# Patient Record
Sex: Male | Born: 1970 | Race: Black or African American | Hispanic: No | Marital: Single | Smoking: Light tobacco smoker
Health system: Southern US, Community
[De-identification: ages and names within clinical notes are randomized; demographics above are authoritative.]

## PROBLEM LIST (undated history)

## (undated) DIAGNOSIS — I517 Cardiomegaly: Secondary | ICD-10-CM

## (undated) DIAGNOSIS — I10 Essential (primary) hypertension: Secondary | ICD-10-CM

## (undated) DIAGNOSIS — R9431 Abnormal electrocardiogram [ECG] [EKG]: Secondary | ICD-10-CM

## (undated) HISTORY — DX: Cardiomegaly: I51.7

## (undated) HISTORY — DX: Abnormal electrocardiogram (ECG) (EKG): R94.31

## (undated) HISTORY — DX: Essential (primary) hypertension: I10

---

## 2002-02-14 ENCOUNTER — Ambulatory Visit (HOSPITAL_BASED_OUTPATIENT_CLINIC_OR_DEPARTMENT_OTHER): Admission: RE | Admit: 2002-02-14 | Discharge: 2002-02-14 | Payer: Self-pay | Admitting: *Deleted

## 2002-02-23 ENCOUNTER — Encounter: Payer: Self-pay | Admitting: Family Medicine

## 2002-02-23 ENCOUNTER — Encounter: Admission: RE | Admit: 2002-02-23 | Discharge: 2002-02-23 | Payer: Self-pay | Admitting: Family Medicine

## 2010-11-17 ENCOUNTER — Encounter: Payer: 59 | Attending: Family Medicine

## 2013-01-23 ENCOUNTER — Other Ambulatory Visit: Payer: Self-pay | Admitting: Orthopedic Surgery

## 2013-01-23 DIAGNOSIS — M542 Cervicalgia: Secondary | ICD-10-CM

## 2013-01-24 ENCOUNTER — Ambulatory Visit
Admission: RE | Admit: 2013-01-24 | Discharge: 2013-01-24 | Disposition: A | Discharge status: Home / Self Care | Payer: 59 | Source: Ambulatory Visit | Attending: Orthopedic Surgery | Admitting: Orthopedic Surgery

## 2013-01-24 DIAGNOSIS — M542 Cervicalgia: Secondary | ICD-10-CM

## 2013-02-07 ENCOUNTER — Ambulatory Visit (INDEPENDENT_AMBULATORY_CARE_PROVIDER_SITE_OTHER): Payer: 59 | Admitting: Cardiology

## 2013-02-07 ENCOUNTER — Encounter: Payer: Self-pay | Admitting: Cardiology

## 2013-02-07 VITALS — BP 142/100 | HR 70 | Ht 72.0 in | Wt 235.8 lb

## 2013-02-07 DIAGNOSIS — R9431 Abnormal electrocardiogram [ECG] [EKG]: Secondary | ICD-10-CM | POA: Insufficient documentation

## 2013-02-07 DIAGNOSIS — I10 Essential (primary) hypertension: Secondary | ICD-10-CM

## 2013-02-07 MED ORDER — HYDROCHLOROTHIAZIDE 25 MG PO TABS: 25.0000 mg | ORAL_TABLET | Freq: Every day | ORAL | Status: DC

## 2013-02-07 NOTE — Assessment & Plan Note (Addendum)
Not adequately controlled.  Do not want LVH to "catch up with" ECG.  PLAN: Restart HCTZ 12.5 mg -- may need to increase to 25mg , just make sure he takes it. Continue Bystolic and losartan the current dose. If HCTZ does not prove effective, would switch to chlorthalidone.  ROV 1 yr.

## 2013-02-07 NOTE — Assessment & Plan Note (Signed)
I have given him a copy of the ECG in the past to avoid unnecessary concern for STEMI.

## 2013-02-07 NOTE — Progress Notes (Addendum)
Patient ID: Samuel Gutierrez, male   DOB: [birth year], 42 y.o.   MRN: [id number] PCP: Paulino Rily, MD  Clinic Note: Chief Complaint  Patient presents with  . 7 month visit    no chest, no sob,no swelling  have not taken med regular    HPI: Samuel Gutierrez is a 42 y.o. male with a PMH below who presents today for follow up of his hypertension. 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  His echo did not confirm the clear ECG evidence of LVH. When I last saw him, his BP was still up a bit & he had not been taking his HCTZ as directed -- this is the case again today.  Interval History: He has been doing well, and after his recent PCP visit for his annual exam, his BP was great.  He decided to stop the HCTZ.  He otherwise denies any Headache, blurred vision or dyspnea related to his BP.    The remainder of cardiac review of systems is as follows: no chest pain or dyspnea on exertion positive for - his self awareness that he has been non-adrent with medications. negative for - edema, irregular heartbeat, loss of consciousness, murmur, orthopnea, palpitations, paroxysmal nocturnal dyspnea, rapid heart rate or shortness of breath.   No lightheadedness, dizziness, wooziness, or syncope/near syncope.  No TIA or Amaurosis Fugax symptoms.  No Melena, hematochezia or hematuria.  Past Medical History  Diagnosis Date  . Benign essential HTN     Has a history of medical nonadherence  . Abnormal resting ECG findings     LVH with repolarization abnormalities.    Prior Cardiac Evaluation and Past Surgical History: Past Surgical History  Procedure Laterality Date  . Doppler echocardiography  March 2010    Borderline LVH, normal function normal relaxation. EF greater than 55%.   Not on File  Current  Outpatient Prescriptions  Medication Sig Dispense Refill  . losartan (COZAAR) 100 MG tablet Take 100 mg by mouth daily.      . multivitamin (ONE-A-DAY MEN'S) TABS Take 1 tablet by mouth daily.      . Nebivolol HCl (BYSTOLIC) 20 MG TABS Take by mouth daily.      . hydrochlorothiazide (HYDRODIURIL) 25 MG tablet Take 1 tablet (25 mg total) by mouth daily.  30 tablet  11   No current facility-administered medications for this visit.    History   Social History  . Marital Status: Single    Spouse Name: N/A    Number of Children: N/A  . Years of Education: N/A   Occupational History  . Not on file.   Social History Main Topics  . Smoking status: Light Tobacco Smoker    Types: Cigars  . Smokeless tobacco: Not on file     Comment: once month; maybe one cigar; cessation counseling not needed  . Alcohol Use: No  . Drug Use: No  . Sexually Active: Not on file   Other Topics Concern  . 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 He is extremely fit and healthy doing routine weightlifting and cardiovascular exercises -- working out 5 days a week   He is currently single with 2 children -- his  schedule is not amenable to relationship with the children's mother.   He does a knowledge of having occasional glass of wine socially. He also will have occasional me one cigar a month with his friends, but nothing significant.   ROS: A comprehensive Review of Systems - Negative except a few MSK aches and pains.  PHYSICAL EXAM BP 142/100  Pulse 70  Ht 6' (1.829 m)  Wt 235 lb 12.8 oz (106.958 kg)  BMI 31.97 kg/m2 General appearance: alert, cooperative, appears stated age, no distress and muscular, healthy. Answers ?s appropriately.  Pleasant mood & affect. Neck: no adenopathy, no carotid bruit, no JVD, supple, symmetrical, trachea midline and thyroid not enlarged, symmetric, no tenderness/mass/nodules Lungs: clear to  auscultation bilaterally, normal percussion bilaterally and non-labored. Good air movement Heart: regular rate and rhythm, S1, S2 normal, no murmur, click, rub or gallop and normal apical impulse Abdomen: soft, non-tender; bowel sounds normal; no masses,  no organomegaly Extremities: extremities normal, atraumatic, no cyanosis or edema Pulses: 2+ and symmetric Neurologic: Grossly normal  [id number] today: Yes Rate: 70 , Rhythm: LVH with significant repolarization abnormality; continues to be read as anterior STEMI, not consistent with ischemia but more with repolarization abnormality. (He carries a copy of this with him just in case)  Recent Labs:   Lipid panel: Total cholesterol 139, triglycerides 80, HDL 32, LDL 89 -- outstanding  PSA 0.88; TSH 1.24  Chem 10: sodium 142, potassium 4.0 chloride 17, bicarbonate 24, glucose 108, BUN/creatinine 11/1.26. Total bilirubin 0.4 alkaline phosphatase 55, AST 40 by, ALT 42 total protein 6.4, albumin 4.1, calcium 9.2  ASSESSMENT/PLAN:  Benign essential HTN Not adequately controlled.  Do not want LVH to "catch up with" ECG.  PLAN: Restart HCTZ 12.5 mg -- may need to increase to 25mg .  ROV 1 yr.  Abnormal resting ECG findings I have given him a copy of the ECG in the past to avoid unnecessary concern for STEMI.   Orders Placed This Encounter  Procedures  . EKG 12-Lead   Followup: 12 months  Mckinzee Spirito W. Herbie Baltimore, M.D., M.S. [facility] [address] [place], [place]  [address]  [phone] Pager # [phone]

## 2013-02-07 NOTE — Patient Instructions (Addendum)
Your physician wants you to follow-up in 12 months Dr Herbie Baltimore. You will receive a reminder letter in the mail two months in advance. If you don't receive a letter, please call our office to schedule the follow-up appointment.    Your physician recommends that you continue on your current medications as directed. Please refer to the Current Medication list given to you today.  Marykay Lex, MD

## 2013-02-16 ENCOUNTER — Encounter: Payer: Self-pay | Admitting: Cardiology

## 2013-06-27 ENCOUNTER — Telehealth: Payer: Self-pay | Admitting: Cardiology

## 2013-06-27 NOTE — Telephone Encounter (Signed)
Bystolic 20 mg is on back order,need to change it to something else please.

## 2013-06-27 NOTE — Telephone Encounter (Signed)
Called pharmacy to inform that patient can take 2-10mg  tabs of bystolic since 20mg  tabs is on back order

## 2013-07-19 ENCOUNTER — Telehealth: Payer: Self-pay | Admitting: Cardiology

## 2013-07-19 MED ORDER — NEBIVOLOL HCL 10 MG PO TABS: 20.0000 mg | ORAL_TABLET | Freq: Every day | ORAL | Status: DC

## 2013-07-19 NOTE — Telephone Encounter (Signed)
Went to pharmacy [facility] [address]  --to pick up his Bystolic and they told him the drug has been discontinued.  Needs to know what can he take now  Please call

## 2013-07-19 NOTE — Telephone Encounter (Signed)
Spoke to patient.He states that [facility] informed [facility] that [facility]olic  20 mg is not available anymore . RN informed patient that there is a problem with the supply of that dose.  He will have to take (2) 10 mg tablet to equal 20 mg dose at the present time until 20 mg become available. He verbalized understanding. Samples are available at the [facility]. Patient will pick  them (6 boxes) up.     Called and spoke to pharmacist Hydrographic surveyor) at [facility] - she states prescription has been changed earlier in the month, but there store is out of 10 mg tablets at the present time.RN asked if they can refill when dose come in stock . Crystal states yes.  RN will inform patient when he picks up samples

## 2013-08-22 ENCOUNTER — Other Ambulatory Visit: Payer: Self-pay | Admitting: Cardiology

## 2013-08-22 NOTE — Telephone Encounter (Signed)
Rx was sent to pharmacy electronically. 

## 2014-05-30 ENCOUNTER — Other Ambulatory Visit: Payer: Self-pay | Admitting: Cardiology

## 2014-05-31 NOTE — Telephone Encounter (Signed)
Rx refill sent to patient pharmacy   

## 2014-06-01 ENCOUNTER — Other Ambulatory Visit: Payer: Self-pay | Admitting: Cardiology

## 2014-06-01 NOTE — Telephone Encounter (Signed)
Rx was sent to pharmacy electronically. 

## 2014-06-26 ENCOUNTER — Other Ambulatory Visit: Payer: Self-pay | Admitting: Cardiology

## 2014-06-26 NOTE — Telephone Encounter (Signed)
Rx was sent to pharmacy electronically. 

## 2014-08-06 ENCOUNTER — Other Ambulatory Visit: Payer: Self-pay | Admitting: Cardiology

## 2014-08-08 ENCOUNTER — Ambulatory Visit (INDEPENDENT_AMBULATORY_CARE_PROVIDER_SITE_OTHER): Payer: 59 | Admitting: Cardiology

## 2014-08-08 VITALS — BP 148/90 | HR 61 | Ht 72.0 in | Wt 244.2 lb

## 2014-08-08 DIAGNOSIS — R9431 Abnormal electrocardiogram [ECG] [EKG]: Secondary | ICD-10-CM

## 2014-08-08 DIAGNOSIS — I10 Essential (primary) hypertension: Secondary | ICD-10-CM

## 2014-08-08 MED ORDER — HYDROCHLOROTHIAZIDE 25 MG PO TABS: 25.0000 mg | ORAL_TABLET | Freq: Every day | ORAL | Status: DC

## 2014-08-08 NOTE — Patient Instructions (Signed)
Your physician wants you to follow-up in: 1 Year. You will receive a reminder letter in the mail two months in advance. If you don't receive a letter, please call our office to schedule the follow-up appointment.  

## 2014-08-10 ENCOUNTER — Encounter: Payer: Self-pay | Admitting: Cardiology

## 2014-08-10 NOTE — Progress Notes (Signed)
Patient ID: Samuel Gutierrez, male   DOB: [birth year], 43 y.o.   MRN: [id number] PCP: Paulino Rily, MD  Clinic Note: Chief Complaint  Patient presents with  . Follow-up    overdue 1 year. no complaints.   . Hypertension  . Abnormal ECG    HPI: Samuel Gutierrez is a 44 y.o. male with a PMH below who presents today for follow up of his hypertension. 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  His echo did not confirm the clear ECG evidence of LVH. When I last saw him, his BP was still up a bit & he had not been taking his HCTZ as directed -- this is the case again today.he stopped taking it when his prescription ran out.  Interval History: He has been doing well, and after his recent PCP visit for his annual exam, his BP was great.  He decided to stop the HCTZ.  He otherwise denies any Headache, blurred vision or dyspnea related to his BP.   He is very active and works out routinely with no cardiac complaints. No heart failure or anginal symptoms.  The remainder of cardiac review of systems is as follows: no chest pain or dyspnea on exertion positive for - his self awareness that he has been non-adrent with medications. negative for - edema, irregular heartbeat, loss of consciousness, murmur, orthopnea, palpitations, paroxysmal nocturnal dyspnea, rapid heart rate or shortness of breath, lightheadedness, dizziness, wooziness, syncope/near syncope, or TIA or Amaurosis Fugax symptoms.  No claudication..  Past Medical History  Diagnosis Date  . Benign essential HTN     Has a history of medical nonadherence  . Abnormal resting ECG findings     LVH with repolarization abnormalities.  Marland Kitchen LVH (left ventricular hypertrophy)     borderline    Prior Cardiac Evaluation  and Past Surgical History: Past Surgical History  Procedure Laterality Date  . Doppler echocardiography  10/03/2008    Borderline LVH, normal function normal relaxation. EF greater than 55%.   No Known Allergies  Current Outpatient Prescriptions  Medication Sig Dispense Refill  . BYSTOLIC 10 MG tablet TAKE 2 TABLETS (20 MG TOTAL) BY MOUTH DAILY. <PLEASE MAKE APPOINTMENT FOR REFILLS> 60 tablet 0  . losartan (COZAAR) 100 MG tablet Take 1 tablet (100 mg total) by mouth daily. MUST KEEP APPOINTMENT 08/08/2014 WITH DR HARDING FOR FUTURE REFILLS. 30 tablet 0  . multivitamin (ONE-A-DAY MEN'S) TABS Take 1 tablet by mouth daily.    . hydrochlorothiazide (HYDRODIURIL) 25 MG tablet Take 1 tablet (25 mg total) by mouth daily. 30 tablet 11   No current facility-administered medications for this visit.   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   He does a knowledge of having occasional glass of wine socially. He also will have occasional me one cigar a month with his friends, but nothing significant.    ROS: A comprehensive Review of Systems - Negative except a few MSK aches and  pains.  PHYSICAL EXAM BP 148/90 mmHg  Pulse 61  Ht 6' (1.829 m)  Wt 244 lb 3.2 oz (110.768 kg)  BMI 33.11 kg/m2 General appearance: alert, cooperative, appears stated age, no distress and muscular, healthy. Answers ?s appropriately.  Pleasant mood & affect. Neck: no adenopathy, no carotid bruit, no JVD, supple, symmetrical, trachea midline and thyroid not enlarged, symmetric, no tenderness/mass/nodules Lungs: clear to auscultation bilaterally, normal percussion bilaterally and non-labored. Good air movement Heart: regular  rate and rhythm, S1, S2 normal, no murmur, click, rub or gallop and normal apical impulse Abdomen: soft, non-tender; bowel sounds normal; no masses,  no organomegaly Extremities: extremities normal, atraumatic, no cyanosis or edema Pulses: 2+ and symmetric Neurologic: Grossly normal  WUJ:[id number]:Performed today: Yes Rate: 61, Rhythm: LVH with significant repolarization abnormality; Diffuse V. Symmetric T-wave inversions in leads V3 through V5 and 123 aVL and aVF consistent with repolarization. Also noted are subtle J-point elevations in V1 and V2. no change from previous EKG.   Recent Labs:   No recent labs available   ASSESSMENT/PLAN:  Essential hypertension Again, the upper level of normal. Lites revealed his HCTZ. I refilled his prescription.   Abnormal resting ECG findings Significant repolarization changes the EKG. No change from previous. Has had a negative cardiac evaluation today. Continues to carry a copy of his EKG along with him just in case.    Orders Placed This Encounter  Procedures  . EKG 12-Lead   Followup: 12 months  HARDING, Piedad ClimesAVID W, M.D., M.S. Interventional Cardiologist   Pager # [phone]

## 2014-08-10 NOTE — Assessment & Plan Note (Signed)
Again, the upper level of normal. Lites revealed his HCTZ. I refilled his prescription.

## 2014-08-10 NOTE — Assessment & Plan Note (Signed)
Significant repolarization changes the EKG. No change from previous. Has had a negative cardiac evaluation today. Continues to carry a copy of his EKG along with him just in case.

## 2014-08-16 ENCOUNTER — Other Ambulatory Visit: Payer: Self-pay | Admitting: Cardiology

## 2014-08-16 NOTE — Telephone Encounter (Signed)
Rx has been sent to the pharmacy electronically. ° °

## 2014-09-12 ENCOUNTER — Other Ambulatory Visit: Payer: Self-pay | Admitting: Cardiology

## 2014-09-12 NOTE — Telephone Encounter (Signed)
Rx(s) sent to pharmacy electronically.  

## 2015-02-17 ENCOUNTER — Encounter (HOSPITAL_COMMUNITY): Payer: Self-pay | Admitting: Emergency Medicine

## 2015-02-17 ENCOUNTER — Emergency Department (HOSPITAL_COMMUNITY)
Admission: EM | Admit: 2015-02-17 | Discharge: 2015-02-17 | Disposition: A | Discharge status: Home / Self Care | Payer: 59 | Source: Home / Self Care | Attending: Family Medicine | Admitting: Family Medicine

## 2015-02-17 DIAGNOSIS — K529 Noninfective gastroenteritis and colitis, unspecified: Secondary | ICD-10-CM | POA: Diagnosis not present

## 2015-02-17 DIAGNOSIS — I10 Essential (primary) hypertension: Secondary | ICD-10-CM

## 2015-02-17 NOTE — ED Notes (Addendum)
Reports going out to eat Friday 7/29.  Reports 2 hours later having nausea, vomiting, and diarrhea.  Today stools are more normal than they have been and vomited x 1 this morning.  Since then has been drinking gatorade and holding it down.  Denies dizziness or lightheadedness

## 2015-02-17 NOTE — ED Provider Notes (Addendum)
CSN: [id number]     Arrival date & time 02/17/15  1512 History   First MD Initiated Contact with Patient 02/17/15 1619     Chief Complaint  Patient presents with  . Emesis  . Diarrhea    Patient is a 44 y.o. male presenting with vomiting, diarrhea, and hypertension. The history is provided by the patient. No language interpreter was used.  Emesis Severity:  Moderate Timing:  Intermittent Quality:  Stomach contents Able to tolerate:  Liquids Progression:  Partially resolved (Last vomitus was this morning) Chronicity:  New Relieved by:  Nothing Associated symptoms: diarrhea   Associated symptoms: no abdominal pain and no fever   Risk factors: no sick contacts and no travel to endemic areas   Risk factors comment:  This started 2 hrs after eating from [employer] Diarrhea Associated symptoms: vomiting   Associated symptoms: no abdominal pain   Hypertension This is a chronic problem. Episode onset: 10 years ago. The problem occurs constantly. Pertinent negatives include no chest pain and no abdominal pain. Relieved by: Losartan and Bystolic are the two medication he is on for BP. The treatment provided moderate relief.   BP check at home are sometimes elevated. He is worried his BP is slightly elevated than usual today.   Past Medical History  Diagnosis Date  . Benign essential HTN     Has a history of medical nonadherence  . Abnormal resting ECG findings     LVH with repolarization abnormalities.  Marland Kitchen LVH (left ventricular hypertrophy)     borderline   Past Surgical History  Procedure Laterality Date  . Doppler echocardiography  10/03/2008    Borderline LVH, normal function normal relaxation. EF greater than 55%.   No family history on file. History  Substance Use Topics  . Smoking status: Light Tobacco Smoker    Types: Cigars  . Smokeless tobacco: Not on file     Comment: once month; maybe one cigar; cessation counseling not needed  . Alcohol Use: No    Review of  Systems  Respiratory: Negative.   Cardiovascular: Negative.  Negative for chest pain.  Gastrointestinal: Positive for vomiting and diarrhea. Negative for abdominal pain, blood in stool and abdominal distention.  Genitourinary: Negative.   All other systems reviewed and are negative.  Filed Vitals:   02/17/15 1600  BP: 166/95  Pulse: 66  Temp: 97.9 F (36.6 C)  TempSrc: Oral  Resp: 18  SpO2: 97%     Allergies  Review of patient's allergies indicates no known allergies.  Home Medications   Prior to Admission medications   Medication Sig Start Date End Date Taking? Authorizing Provider  hydrochlorothiazide (HYDRODIURIL) 25 MG tablet Take 1 tablet (25 mg total) by mouth daily. 08/08/14   Marykay Lex, MD  losartan (COZAAR) 100 MG tablet Take 1 tablet (100 mg total) by mouth daily. 08/16/14   Marykay Lex, MD  multivitamin (ONE-A-DAY MEN'S) TABS Take 1 tablet by mouth daily.    Historical Provider, MD  nebivolol (BYSTOLIC) 10 MG tablet Take 2 tablets (20 mg total) by mouth daily. 09/12/14   Marykay Lex, MD   BP 166/95 mmHg  Pulse 66  Temp(Src) 97.9 F (36.6 C) (Oral)  Resp 18  SpO2 97% Physical Exam  Constitutional: He appears well-developed. No distress.  Cardiovascular: Normal rate, regular rhythm, normal heart sounds and intact distal pulses.   No murmur heard. Pulmonary/Chest: Effort normal and breath sounds normal. No respiratory distress. He has no wheezes.  Abdominal: Soft. Bowel sounds are normal. He exhibits no distension and no mass. There is no tenderness.  Neurological: He is alert.  Nursing note and vitals reviewed.   ED Course  Procedures (including critical care time) Labs Review Labs Reviewed - No data to display  Imaging Review No results found.   MDM  No diagnosis found. Gastroenteritis: food poisoning vs viral infection HTN   As discussed with him, he likely has food poisoning. Symptoms rapidly improving. I recommended good oral and  hand hygiene. Improve oral hydration as tolerated. May use Imodium if symptoms returns. Return precaution discussed.  BP rechecked today remains at 162/90. As discussed with him, no adjustment to his medication at this time. Follow up with PCP soon for reassessment and further management.    Doreene Eland, MD 02/17/15 1637  Doreene Eland, MD 02/17/15 9604

## 2015-02-17 NOTE — Discharge Instructions (Signed)
Food Poisoning °Food poisoning is an illness caused by something you ate or drank. It usually lasts 1 to 2 days. Problems may be worse for people with low immune systems, the elderly, children and infants, and pregnant women. °HOME CARE °· Drink enough water and fluids to keep your pee (urine) clear or pale yellow. Drink small amounts often. °· Ask your doctor how to replace body fluid losses (rehydration). °· Avoid: °¨ Foods high in sugar. °¨ Alcohol. °¨ Bubbly (carbonated) drinks. °¨ Tobacco. °¨ Juice. °¨ Caffeine drinks. °¨ Very hot or cold fluids. °¨ Fatty, greasy foods. °¨ Eating too much at one time. °¨ Dairy products until 24 to 48 hours after watery poop (diarrhea) stops. °· You may eat foods with active cultures (probiotics). They can be found in some yogurts and supplements. °· Wash your hands well to avoid spreading the illness. °· Only take medicines as told by your doctor. Do not give aspirin to children. °· Ask your doctor if you should keep taking your regular medicines. °GET HELP RIGHT AWAY IF:  °· You have trouble breathing, swallowing, talking, or moving. °· You have blurry vision. °· You cannot keep fluids down. °· You pass out (faint) or almost pass out. °· Your eyes turn yellow. °· You keep throwing up (vomiting) or having watery poop. °· Belly (abdominal) pain starts, gets worse, or is just in one small spot (localizes). °· You have a fever. °· Your watery poop has blood in it. °· You feel very weak, dizzy, or thirsty. °· You do not pee for 8 hours. °MAKE SURE YOU:  °· Understand these instructions. °· Will watch your condition. °· Will get help right away if you are not doing well or get worse. °Document Released: 12/24/2009 Document Revised: 09/28/2011 Document Reviewed: 11/21/2010 °Exit[employer]® Patient Information ©2015 [employer]. This information is not intended to replace advice given to you by your health care provider. Make sure you discuss any questions you have with your health care  provider. ° °

## 2015-08-09 ENCOUNTER — Encounter: Payer: Self-pay | Admitting: Cardiology

## 2015-08-09 ENCOUNTER — Ambulatory Visit (INDEPENDENT_AMBULATORY_CARE_PROVIDER_SITE_OTHER): Payer: 59 | Admitting: Cardiology

## 2015-08-09 ENCOUNTER — Telehealth: Payer: Self-pay | Admitting: Cardiology

## 2015-08-09 VITALS — BP 164/100 | HR 74 | Ht 72.0 in | Wt 237.0 lb

## 2015-08-09 DIAGNOSIS — R9431 Abnormal electrocardiogram [ECG] [EKG]: Secondary | ICD-10-CM | POA: Diagnosis not present

## 2015-08-09 DIAGNOSIS — I10 Essential (primary) hypertension: Secondary | ICD-10-CM | POA: Diagnosis not present

## 2015-08-09 MED ORDER — NEBIVOLOL HCL 10 MG PO TABS: 20.0000 mg | ORAL_TABLET | Freq: Every day | ORAL | Status: DC

## 2015-08-09 MED ORDER — IRBESARTAN 300 MG PO TABS: 300.0000 mg | ORAL_TABLET | Freq: Every day | ORAL | Status: DC

## 2015-08-09 MED ORDER — HYDROCHLOROTHIAZIDE 25 MG PO TABS: 25.0000 mg | ORAL_TABLET | Freq: Every day | ORAL | Status: DC

## 2015-08-09 MED ORDER — NEBIVOLOL HCL 20 MG PO TABS: 20.0000 mg | ORAL_TABLET | Freq: Every day | ORAL | Status: DC

## 2015-08-09 NOTE — Patient Instructions (Signed)
Stop losartan   Start irbesartan 300 mg one tablet daily - for the first week take 1/2 tablet daily then increase  Your physician recommends that you schedule a follow-up appointment in 1 week with [facility] for blood  Pressure check .  Check blood pressure periodically.   Your physician wants you to follow-up in 12 month with Dr Herbie Baltimore.  You will receive a reminder letter in the mail two months in advance. If you don't receive a letter, please call our office to schedule the follow-up appointment.

## 2015-08-09 NOTE — Telephone Encounter (Signed)
SPOKE TO PATIENT  UNABLE TO PURCHASE BYSTOLIC - TO EARLY FOR REFILL - PATIENT LOST CURRENT BOTTLE  SAMPLES AVAILABLE FOR PICK UP

## 2015-08-09 NOTE — Telephone Encounter (Signed)
Returning your call. °

## 2015-08-09 NOTE — Progress Notes (Signed)
PCP: Paulino Rily, MD  Clinic Note: Chief Complaint  Patient presents with  . Follow-up    no chest pain, no shortness of breath, no swelling, no cramping, no dizziness or lightheadedness  . Hypertension  . Abnormal ECG    Significant T-wave inversions and ST depressions consistent with repolarization abnormality.    HPI: Samuel Gutierrez is a 45 y.o. male with a PMH below who presents today for annual f/u for HTN & abnormal EKG. 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 His echo did not confirm the clear ECG evidence of LVH.  Samuel Gutierrez was last seen on 08/08/2014 - borderline blood pressure control at that time. Added HCTZ.  Recent Hospitalizations: None  Studies Reviewed: None   Chemistry   No results found for: NA, K, CL, CO2, BUN, CREATININE, GLU No results found for: CALCIUM, ALKPHOS, AST, ALT, BILITOT   Interval History: Samuel Gutierrez presents today again without any major complaints. He had just gotten his medications refilled over the weekend, but somehow lost them in his car or they must have fallen out of his car between Monday and Tuesday. He has been out of his medicine since then. Thankfully, he has not noted any adverse effects such as blurred vision, headaches or dyspnea. He also notes that he works the night shift and coming in the morning sometimes he has not had a chance to take his medications. Sometimes his work to be relatively stressful and demanding.  He does note, there is blood pressures have been higher than usual.  Cardiac review symptoms No chest pain or shortness of breath with rest or exertion.  No PND, orthopnea or edema. No palpitations, lightheadedness, dizziness, weakness or syncope/near syncope. No TIA/amaurosis fugax  symptoms. No claudication.  ROS: A comprehensive was performed. Review of Systems  Constitutional: Negative for malaise/fatigue.  HENT: Negative for nosebleeds.   Eyes: Negative for blurred vision.  Respiratory: Negative.        He did have a cold earlier this month  Cardiovascular: Negative for claudication.  Gastrointestinal: Positive for heartburn (depending what he eats).  Genitourinary: Negative.   Musculoskeletal: Negative.   Neurological: Negative.  Negative for weakness and headaches.  Endo/Heme/Allergies: Negative.   Psychiatric/Behavioral: Negative.   All other systems reviewed and are negative.   Past Medical History  Diagnosis Date  . Benign essential HTN     Has a history of medical nonadherence  . Abnormal resting ECG findings     LVH with repolarization abnormalities.  Marland Kitchen LVH (left ventricular hypertrophy)     borderline    Past Surgical History  Procedure Laterality Date  . Doppler echocardiography  10/03/2008    Borderline LVH, normal function normal relaxation. EF greater than 55%.    Prior to Admission medications   Medication Sig Start Date End Date Taking? Authorizing Provider  hydrochlorothiazide (HYDRODIURIL) 25 MG tablet Take 1 tablet (25 mg total) by mouth daily. 08/08/14   Marykay Lex, MD  losartan (COZAAR) 100 MG tablet Take 1 tablet (100 mg total) by mouth daily. 08/16/14   Marykay Lex, MD  multivitamin (ONE-A-DAY MEN'S) TABS Take 1 tablet by mouth daily.    Historical Provider, MD  nebivolol (BYSTOLIC) 10 MG tablet Take 2 tablets (20 mg total) by mouth daily.  09/12/14   Marykay Lex, MD   No Known Allergies   Social History   Social History  . Marital Status: Single    Spouse Name: N/A  . Number of Children: N/A  . Years of Education: N/A   Social History Main Topics  . Smoking status: Light Tobacco Smoker    Types: Cigars  . Smokeless tobacco: None     Comment: once month; maybe one cigar; cessation counseling not needed    . Alcohol Use: No  . Drug Use: No  . Sexual Activity: Not Asked   Other Topics Concern  . 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   He does a knowledge of having occasional glass of wine socially. He also will have occasional me one cigar a month with his friends, but nothing significant.    History reviewed. No pertinent family history.   Wt Readings from Last 3 Encounters:  08/09/15 237 lb (107.502 kg)  08/08/14 244 lb 3.2 oz (110.768 kg)  02/07/13 235 lb 12.8 oz (106.958 kg)    PHYSICAL EXAM BP 164/100 mmHg  Pulse 74  Ht 6' (1.829 m)  Wt 237 lb (107.502 kg)  BMI 32.14 kg/m2 General appearance: alert, cooperative, appears stated age, no distress and muscular, healthy. Answers ?s appropriately. Pleasant mood & affect. Neck: no adenopathy, no carotid bruit, no JVD, supple, symmetrical, trachea midline and thyroid not enlarged, symmetric, no tenderness/mass/nodules Lungs: clear to auscultation bilaterally, normal percussion bilaterally and non-labored. Good air movement Heart: regular rate and rhythm, S1, S2 normal, no murmur, click, rub or gallop and normal apical impulse Abdomen: soft, non-tender; bowel sounds normal; no masses, no organomegaly Extremities: extremities normal, atraumatic, no cyanosis or edema Pulses: 2+ and symmetric Neurologic: Grossly normal    Adult ECG Report  Rate: 74  Rhythm: normal sinus rhythm and LVH with dramatic ST-T wave abnormalities consistent with repolarization abnormality. There was asymmetrical downsloping ST segments with T-wave inversions in leads I, II, III, aVL, aVF, V2-V6;   Narrative Interpretation:  stable EKG   Other studies  Reviewed: Additional studies/ records that were reviewed today include:  Recent Labs:  None    ASSESSMENT / PLAN: Problem List Items Addressed This Visit    Essential hypertension - Primary (Chronic)    Blood pressure no he hasn't taken medications for a while, but this is higher than I would like. Will convert from losartan 100 mg to irbesartan 300 mg. Refill Bystolic and HCTZ.  He will see Samuel Gutierrez, Pharm.D in roughly 1 week for blood pressure check and titration of medications. She will then follow with him intermittently to ensure that his blood pressure remains stable. I will see him back in one year.       Relevant Medications   hydrochlorothiazide (HYDRODIURIL) 25 MG tablet   irbesartan (AVAPRO) 300 MG tablet   Nebivolol HCl 20 MG TABS   nebivolol (BYSTOLIC) 10 MG tablet   Other Relevant Orders   EKG 12-Lead   Abnormal resting ECG findings (Chronic)    Dramatic repolarization changes, but no change from previous. Has had a negative cardiac evaluation in the past. He carries a copy of his EKG along  with him.      Relevant Orders   EKG 12-Lead      Current medicines are reviewed at length with the patient today. (+/- concerns) lost his BP meds  The following changes have been made:  Stop losartan   Start irbesartan 300 mg one tablet daily - for the first week take 1/2 tablet daily then increase  Your physician recommends that you schedule a follow-up appointment in 1 week with [facility] for blood  Pressure check .  Check blood pressure periodically.   Your physician wants you to follow-up in 12 month with Dr Herbie Baltimore.   Studies Ordered:   Orders Placed This Encounter  Procedures  . EKG 12-Lead      Marykay Lex, M.D., M.S. Interventional Cardiologist   Pager # [phone] Phone # [phone] [address] [place], [place] [address]

## 2015-08-11 ENCOUNTER — Encounter: Payer: Self-pay | Admitting: Cardiology

## 2015-08-11 NOTE — Assessment & Plan Note (Addendum)
Blood pressure no he hasn't taken medications for a while, but this is higher than I would like. Will convert from losartan 100 mg to irbesartan 300 mg. Refill Bystolic and HCTZ.  He will see Phillips Hay, Pharm.D in roughly 1 week for blood pressure check and titration of medications. She will then follow with him intermittently to ensure that his blood pressure remains stable. I will see him back in one year.

## 2015-08-11 NOTE — Assessment & Plan Note (Signed)
Dramatic repolarization changes, but no change from previous. Has had a negative cardiac evaluation in the past. He carries a copy of his EKG along with him.

## 2015-08-15 ENCOUNTER — Ambulatory Visit: Payer: 59 | Admitting: Pharmacist Clinician (PhC)/ Clinical Pharmacy Specialist

## 2015-08-22 ENCOUNTER — Ambulatory Visit: Payer: 59 | Admitting: Pharmacist Clinician (PhC)/ Clinical Pharmacy Specialist

## 2015-08-26 ENCOUNTER — Telehealth: Payer: Self-pay | Admitting: Cardiology

## 2015-08-26 MED ORDER — NEBIVOLOL HCL 10 MG PO TABS: 20.0000 mg | ORAL_TABLET | Freq: Every day | ORAL | Status: DC

## 2015-08-26 NOTE — Telephone Encounter (Signed)
Samples provided and patient aware available for pickup.

## 2015-08-26 NOTE — Telephone Encounter (Signed)
Patient calling the office for samples of medication:   1.  What medication and dosage are you requesting samples for? Bystolic    2.  Are you currently out of this medication? No

## 2015-08-30 ENCOUNTER — Other Ambulatory Visit: Payer: Self-pay | Admitting: Cardiology

## 2015-08-30 NOTE — Telephone Encounter (Signed)
Losartan >> irbesartan 07/2015 by MD

## 2016-09-03 ENCOUNTER — Other Ambulatory Visit: Payer: Self-pay | Admitting: Cardiology

## 2016-09-03 NOTE — Telephone Encounter (Signed)
Rx(s) sent to pharmacy electronically.  

## 2016-10-07 ENCOUNTER — Ambulatory Visit (INDEPENDENT_AMBULATORY_CARE_PROVIDER_SITE_OTHER): Payer: 59 | Admitting: Cardiology

## 2016-10-07 ENCOUNTER — Encounter: Payer: Self-pay | Admitting: Cardiology

## 2016-10-07 ENCOUNTER — Other Ambulatory Visit: Payer: Self-pay | Admitting: Cardiology

## 2016-10-07 VITALS — BP 130/72 | HR 77 | Ht 72.0 in | Wt 240.0 lb

## 2016-10-07 DIAGNOSIS — I10 Essential (primary) hypertension: Secondary | ICD-10-CM

## 2016-10-07 DIAGNOSIS — R011 Cardiac murmur, unspecified: Secondary | ICD-10-CM | POA: Diagnosis not present

## 2016-10-07 DIAGNOSIS — R9431 Abnormal electrocardiogram [ECG] [EKG]: Secondary | ICD-10-CM

## 2016-10-07 NOTE — Assessment & Plan Note (Signed)
Dramatic repolarization changes which are consistent with LVH. We are to continue echocardiogram to determine the extent of LVH. Otherwise no signs of ischemia.or arrhythmia

## 2016-10-07 NOTE — Progress Notes (Signed)
PCP: Paulino Rily, MD  Clinic Note: Chief Complaint  Patient presents with  . Hypertension    Difficult to control    HPI: Samuel Gutierrez is a 46 y.o. male with a PMH below who presents today for ~annual f/u for essential hypertension and abnormal EKG likely consistent with repolarization abnormality. 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 He was referred for an abnormal EKG that looks quite impressively like an anterior STEMI with anterior and inferolateral T-wave inversions. He has had a negative cardiac workup clinically and echo did not show any of LVH.  Samuel Gutierrez was last seen on angiogram 2017. At that time his blood pressure 164/100 mmHg. I converted him from losartan to irbesartan and refilled his HCTZ plus Bystolic.  Recent Hospitalizations: None  Studies Reviewed: None  Interval History: Samuel Gutierrez presents today feeling quite well. He has not had any further symptoms of headache or blurred vision. No chest tightness or pressure with rest or exertion. No resting or exertional dyspnea. He is very active still does weightlifting and cardio.  No PND, orthopnea or edema. No palpitations, lightheadedness, dizziness, weakness or syncope/near syncope. No TIA/amaurosis fugax symptoms.  No claudication.  ROS: A comprehensive was performed. Review of Systems  All other systems reviewed and are negative.   Past Medical History:  Diagnosis Date  . Abnormal resting ECG findings    LVH with repolarization abnormalities.  . Benign essential HTN    Has a history of medical nonadherence  . LVH (left ventricular hypertrophy)    borderline    Past Surgical History:  Procedure Laterality Date  . DOPPLER ECHOCARDIOGRAPHY  10/03/2008   Borderline LVH, normal function normal relaxation. EF greater than 55%.    Current Meds  Medication Sig  . multivitamin (ONE-A-DAY MEN'S) TABS Take 1 tablet by mouth daily.  . mupirocin cream (BACTROBAN) 2 % USE 1 APPLICATION  TO AFFECTED AREA BETWEEN TOES THREE TIMES DAILY  . Nebivolol HCl 20 MG TABS Take 1 tablet (20 mg total) by mouth daily.  . [DISCONTINUED] hydrochlorothiazide (HYDRODIURIL) 25 MG tablet Take 1 tablet (25 mg total) by mouth daily. <PLEASE MAKE APPOINTMENT FOR REFILLS>  . [DISCONTINUED] irbesartan (AVAPRO) 300 MG tablet Take 1 tablet (300 mg total) by mouth daily. <PLEASE MAKE APPOINTMENT FOR REFILLS>  . [DISCONTINUED] nebivolol (BYSTOLIC) 10 MG tablet Take 2 tablets (20 mg total) by mouth daily.  . [DISCONTINUED] nebivolol (BYSTOLIC) 10 MG tablet Take 2 tablets (20 mg total) by mouth daily. <PLEASE MAKE APPOINTMENT FOR REFILLS>    No Known Allergies  Social History   Social History  . Marital status: Single    Spouse name: N/A  . Number of children: N/A  . Years of education: N/A   Social History Main Topics  . Smoking status: Light Tobacco Smoker    Types: Cigars  . Smokeless tobacco: Never Used     Comment: once month; maybe one cigar; cessation counseling not needed  . Alcohol use No  . Drug use: No  . Sexual activity: Not Asked   Other Topics Concern  . 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   He does a knowledge of  having occasional glass of wine socially. He also will have occasional me one cigar a month with his friends, but nothing significant.    family history is not on file.  Wt Readings from Last 3 Encounters:  10/07/16 108.9 kg (240 lb)  08/09/15 107.5 kg (237 lb)  08/08/14 110.8 kg (244 lb 3.2 oz)    PHYSICAL EXAM BP 130/72   Pulse 77   Ht 6' (1.829 m)   Wt 108.9 kg (240 lb)   BMI 32.55 kg/m  General appearance: alert, cooperative, appears stated age, no  distress and muscular, healthy. Answers ?s appropriately. Pleasant mood & affect. Neck: no adenopathy, no carotid bruit, no JVD, supple, symmetrical, trachea midline and thyroid not enlarged, symmetric, no tenderness/mass/nodules Lungs: clear to auscultation bilaterally, normal percussion bilaterally and non-labored. Good air movement Heart: regular rate and rhythm, S1& S2 normal, 1-2/6 ~SM @ LSB; No, click, rub or gallop and normal apical impulse Abdomen: soft, non-tender; bowel sounds normal; no masses, no organomegaly Extremities: extremities normal, atraumatic, no cyanosis or edema Pulses: 2+ and symmetric Neurologic: Grossly normal    Adult ECG Report  Rate: 77 ;  Rhythm: normal sinus rhythm and Voltage for LVH with repolarization abnormality noted = ST depression with T-wave inversion noted in I through III as well as the 3 through V6 with subtle ST elevations in V1 and V2. Otherwise normal intervals and durations.;   Narrative Interpretation: Stable EKG   Other studies Reviewed: Additional studies/ records that were reviewed today include:  Recent Labs:  Followed by PCP   ASSESSMENT / PLAN: Problem List Items Addressed This Visit    Abnormal resting ECG findings (Chronic)    Dramatic repolarization changes which are consistent with LVH. We are to continue echocardiogram to determine the extent of LVH. Otherwise no signs of ischemia.or arrhythmia      Relevant Orders   ECHOCARDIOGRAM COMPLETE   Essential hypertension - Primary (Chronic)    Well-controlled blood pressure today. Continue current dose of Avapro, Bystolic and HCTZ.      Relevant Orders   ECHOCARDIOGRAM COMPLETE   Newly recognized heart murmur    I don't recall having heard a murmur before, he has had an echocardiogram that was done 8 years ago, his EKG continues to look like LVH despite having well-controlled blood pressures. Plan: 2-D echocardiogram to exclude mitral valve disease versus worsening LVH with  LVOT dynamic gradient.      Relevant Orders   ECHOCARDIOGRAM COMPLETE      Current medicines are reviewed at length with the patient today. (+/- concerns) None The following changes have been made: None  Patient Instructions  Medication Instructions:  Your physician recommends that you continue on your current medications as directed. Please refer to the Current Medication list given to you today.  Labwork: None   Testing/Procedures: Your physician has requested that you have an echocardiogram. Echocardiography is a painless test that uses sound waves to create images of your heart. It provides your doctor with information about the size and shape of your heart and how well your heart's chambers and valves are working. This procedure takes approximately one hour. There are no restrictions for this procedure. IF TEST RESULTS ARE NORMAL WE WILL SEE YOU IN A YEAR, OTHER WISE WE WILL CONTACT YOU FOR FURTHER INSTRUCTIONS  Follow-Up: Your physician wants you to follow-up in: 12 MONTHS WITH DR Emileigh Kellett. You will receive a reminder letter in the mail two months in advance. If you don't receive a letter,  please call our office to schedule the follow-up appointment.  Any Other Special Instructions Will Be Listed Below (If Applicable).     If you need a refill on your cardiac medications before your next appointment, please call your pharmacy.     Studies Ordered:   Orders Placed This Encounter  Procedures  . ECHOCARDIOGRAM COMPLETE      Bryan Lemmaavid Reyden Smith, M.D., M.S. Interventional Cardiologist   Pager # [phone] Phone # [phone] [address] [place], [place] [id number]

## 2016-10-07 NOTE — Patient Instructions (Signed)
Medication Instructions:  Your physician recommends that you continue on your current medications as directed. Please refer to the Current Medication list given to you today.  Labwork: None   Testing/Procedures: Your physician has requested that you have an echocardiogram. Echocardiography is a painless test that uses sound waves to create images of your heart. It provides your doctor with information about the size and shape of your heart and how well your heart's chambers and valves are working. This procedure takes approximately one hour. There are no restrictions for this procedure. IF TEST RESULTS ARE NORMAL WE WILL SEE YOU IN A YEAR, OTHER WISE WE WILL CONTACT YOU FOR FURTHER INSTRUCTIONS  Follow-Up: Your physician wants you to follow-up in: 12 MONTHS WITH DR HARDING. You will receive a reminder letter in the mail two months in advance. If you don't receive a letter, please call our office to schedule the follow-up appointment.  Any Other Special Instructions Will Be Listed Below (If Applicable).     If you need a refill on your cardiac medications before your next appointment, please call your pharmacy.

## 2016-10-07 NOTE — Assessment & Plan Note (Signed)
I don't recall having heard a murmur before, he has had an echocardiogram that was done 8 years ago, his EKG continues to look like LVH despite having well-controlled blood pressures. Plan: 2-D echocardiogram to exclude mitral valve disease versus worsening LVH with LVOT dynamic gradient.

## 2016-10-07 NOTE — Assessment & Plan Note (Signed)
Well-controlled blood pressure today. Continue current dose of Avapro, Bystolic and HCTZ.

## 2016-10-08 NOTE — Addendum Note (Signed)
Addended by: Beryle Quant on: 10/08/2016 03:55 PM   Modules accepted: Orders

## 2016-10-18 HISTORY — PX: TRANSTHORACIC ECHOCARDIOGRAM: SHX275

## 2016-10-22 ENCOUNTER — Other Ambulatory Visit: Payer: Self-pay

## 2016-10-22 ENCOUNTER — Ambulatory Visit (HOSPITAL_COMMUNITY): Payer: 59 | Attending: Cardiovascular Disease

## 2016-10-22 DIAGNOSIS — I10 Essential (primary) hypertension: Secondary | ICD-10-CM

## 2016-10-22 DIAGNOSIS — I119 Hypertensive heart disease without heart failure: Secondary | ICD-10-CM | POA: Diagnosis not present

## 2016-10-22 DIAGNOSIS — R011 Cardiac murmur, unspecified: Secondary | ICD-10-CM | POA: Insufficient documentation

## 2016-10-22 DIAGNOSIS — R9431 Abnormal electrocardiogram [ECG] [EKG]: Secondary | ICD-10-CM | POA: Insufficient documentation

## 2017-10-14 ENCOUNTER — Other Ambulatory Visit: Payer: Self-pay | Admitting: Cardiology

## 2017-10-21 ENCOUNTER — Encounter: Payer: Self-pay | Admitting: Cardiology

## 2017-10-21 ENCOUNTER — Ambulatory Visit: Payer: 59 | Admitting: Cardiology

## 2017-10-21 VITALS — BP 142/102 | HR 65 | Ht 72.0 in | Wt 234.8 lb

## 2017-10-21 DIAGNOSIS — R011 Cardiac murmur, unspecified: Secondary | ICD-10-CM | POA: Diagnosis not present

## 2017-10-21 DIAGNOSIS — I10 Essential (primary) hypertension: Secondary | ICD-10-CM | POA: Diagnosis not present

## 2017-10-21 DIAGNOSIS — R9431 Abnormal electrocardiogram [ECG] [EKG]: Secondary | ICD-10-CM

## 2017-10-21 MED ORDER — HYDROCHLOROTHIAZIDE 25 MG PO TABS: 25.0000 mg | ORAL_TABLET | Freq: Every day | ORAL | 11 refills | Status: DC

## 2017-10-21 MED ORDER — IRBESARTAN 300 MG PO TABS: 300.0000 mg | ORAL_TABLET | Freq: Every day | ORAL | 11 refills | Status: DC

## 2017-10-21 MED ORDER — NEBIVOLOL HCL 20 MG PO TABS: 20.0000 mg | ORAL_TABLET | Freq: Every day | ORAL | 11 refills | Status: DC

## 2017-10-21 NOTE — Progress Notes (Signed)
Is there is trash is that we put that were my  PCP: Knox Royalty, MD  Clinic Note: Chief Complaint  Patient presents with  . Follow-up    pt denied chest pain and SOB    HPI: Samuel Gutierrez is a 47 y.o. 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 He was referred for an abnormal EKG that looks quite impressively like an anterior STEMI with anterior and inferolateral T-wave inversions. He has had a negative cardiac workup clinically and echo did not show any of LVH.  He was last seen on October 07, 2016 - doing well.  BP stable.  Recent Hospitalizations: n/a  Studies Personally Reviewed - (if available, images/films reviewed: From Epic Chart or Care Everywhere)  2 D Echo 10/22/16: Normal LV size with vigorous motion.  EF 65-70%.  No dynamic obstruction, but hyperdynamic LV.  Increased flow velocities intr  acavity.  This is likely the cause of the murmur.  No evidence of valvular or subvalvular disease.  GR 1 DD  Interval History: Samuel Gutierrez returns here today Indicating that he has not been taking his medications quite like he should.  He is not taking his HCTZ.  Basically he was concerned about the cost of Bystolic and decided not to take his HCTZ.   No chest pain or shortness of breath with rest or exertion. No PND, orthopnea or edema. No palpitations, lightheadedness, dizziness, weakness or syncope/near syncope. No TIA/amaurosis fugax symptoms. No claudication.  ROS: A comprehensive was performed. Review of Systems  Constitutional: Negative for chills, fever and malaise/fatigue.  HENT: Negative for congestion, nosebleeds and sinus pain.   Respiratory: Negative for cough and shortness of breath.   Cardiovascular: Negative for claudication.  Gastrointestinal: Negative for blood in stool and melena.    Genitourinary: Negative for hematuria.  Musculoskeletal: Negative.   Neurological: Negative for dizziness, weakness and headaches.  Psychiatric/Behavioral: Negative.   All other systems reviewed and are negative.   I have reviewed and (if needed) personally updated the patient's problem list, medications, allergies, past medical and surgical history, social and family history.   Past Medical History:  Diagnosis Date  . Abnormal resting ECG findings    LVH with repolarization abnormalities.  . Benign essential HTN    Has a history of medical nonadherence  . LVH (left ventricular hypertrophy)    borderline    Past Surgical History:  Procedure Laterality Date  . TRANSTHORACIC ECHOCARDIOGRAM  10/2016   Normal LV size with vigorous motion.  EF 65-70%.  No dynamic obstruction, but hyperdynamic LV.  Increased flow velocities intracavity.  This is likely the cause of the murmur.  No evidence of valvular or subvalvular disease.  GR 1 DD    Current Meds  Medication Sig  . hydrochlorothiazide (HYDRODIURIL) 25 MG tablet Take 1 tablet (25 mg total) by mouth daily.  . irbesartan (AVAPRO) 300 MG tablet Take 1 tablet (300 mg total) by mouth daily.  . multivitamin (ONE-A-DAY MEN'S) TABS Take 1 tablet by mouth daily.  . Nebivolol HCl 20 MG TABS Take 1 tablet (20 mg total) by mouth daily.  . [DISCONTINUED] hydrochlorothiazide (HYDRODIURIL) 25 MG tablet TAKE 1 TABLET (25 MG TOTAL) BY MOUTH DAILY. <PLEASE MAKE APPOINTMENT FOR REFILLS>  . [DISCONTINUED] irbesartan (AVAPRO) 300 MG tablet TAKE 1 TABLET (300 MG TOTAL) BY  MOUTH DAILY. <PLEASE MAKE APPOINTMENT FOR REFILLS>  . [DISCONTINUED] Nebivolol HCl 20 MG TABS Take 1 tablet (20 mg total) by mouth daily.    No Known Allergies  Social History   Tobacco Use  . Smoking status: Light Tobacco Smoker    Types: Cigars  . Smokeless tobacco: Never Used  . Tobacco comment: once month; maybe one cigar; cessation counseling not needed  Substance Use  Topics  . Alcohol use: No  . 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 He is extremely fit and healthy doing routine weightlifting and cardiovascular exercises -- working out 5 days a week   He is currently single with 2 children -- his schedule is not amenable to relationship with the children's mother.   He does a knowledge of having occasional glass of wine socially. He also will have occasional me one cigar a month with his friends, but nothing significant.    family history includes Diabetes Mellitus II in his maternal grandmother and mother; Hypertension in his maternal grandmother and mother.  Wt Readings from Last 3 Encounters:  10/21/17 234 lb 12.8 oz (106.5 kg)  10/07/16 240 lb (108.9 kg)  08/09/15 237 lb (107.5 kg)    PHYSICAL EXAM BP (!) 142/102   Pulse 65   Ht 6' (1.829 m)   Wt 234 lb 12.8 oz (106.5 kg)   BMI 31.84 kg/m  Physical Exam  Constitutional: He is oriented to person, place, and time. He appears well-developed and well-nourished. No distress.  Although his BMI is indicating him being obese, he is clearly not obese.  Very muscular and athletic appearing.  Well-groomed.  Healthy  HENT:  Head: Normocephalic and atraumatic.  Eyes: EOM are normal. No scleral icterus.  Neck: Normal range of motion. Neck supple. No hepatojugular reflux and no JVD present. Carotid bruit is not present.  Cardiovascular: Normal rate, regular rhythm, intact distal pulses and normal pulses.  No extrasystoles are present. PMI is not displaced. Exam reveals no gallop and no friction rub.  Murmur heard.  Medium-pitched harsh crescendo-decrescendo early systolic murmur is present with a grade of 1/6 at the upper right sternal border radiating to the neck. Pulmonary/Chest: Effort normal and breath sounds normal. No respiratory distress. He has no wheezes. He has no rales.  Abdominal: Soft. Bowel sounds  are normal. He exhibits no distension. There is no tenderness.  Musculoskeletal: Normal range of motion. He exhibits no edema.  Neurological: He is alert and oriented to person, place, and time.  Psychiatric: He has a normal mood and affect. His behavior is normal. Judgment and thought content normal.  Nursing note and vitals reviewed.    Adult ECG Report  Rate: 65;  Rhythm: normal sinus rhythm and LVH with repolarization of normalities.  Cannot exclude lateral ischemia.  Normal axis, intervals and durations;   Narrative Interpretation: Stable EKG   Other studies Reviewed: Additional studies/ records that were reviewed today include:  Recent Labs:   No recent labs   ASSESSMENT / PLAN: Problem List Items Addressed This Visit    Heart murmur (Chronic)    This may have a little bit doing potentially being dehydrated.  I do not hear as much of a murmur today.  He has had vigorous LV motion with some LVH which could cause an outflow tract murmur. Indicated the importance of adequate hydration to avoid exacerbation of this  gradient.      Essential hypertension - Primary (Chronic)    Not as well-controlled today.  He has not been taking his HCTZ.  He will restart HCTZ.  We talked about the cost of Bystolic and the reasons for using it being to reduce the side effects.  Once he realized and understood the reasoning behind it, he agreed that that is something he would not want to change.  I mentioned that we can convert the Avapro and HCTZ to a combination medication however I think this may increase the cost.      Relevant Medications   hydrochlorothiazide (HYDRODIURIL) 25 MG tablet   irbesartan (AVAPRO) 300 MG tablet   Nebivolol HCl 20 MG TABS   Other Relevant Orders   EKG 12-Lead (Completed)   Abnormal resting ECG findings (Chronic)    Stable LVH with repolarization abnormalities noted.  I have given him a copy of this EKG to keep with him in both of his vehicles.      Relevant  Orders   EKG 12-Lead (Completed)      I spent a total of 20 minutes with the patient and chart review. >  50% of the time was spent in direct patient consultation.   Current medicines are reviewed at length with the patient today.  (+/- concerns)  - Hasn't been taking HCTZ. ? Cost of Bstolic vs. Alternatives. The following changes have been made:  restart HCTZ -- discussed thought process behind Bystolic - to reduce side effects of fatigue & decreased Libido.  Patient Instructions  NO CHANGE WITH MEDICATIONS   RESTART TAKING HCTZ -    Your physician wants you to follow-up in 12 MONTHS WITH DR Quamere Mussell.You will receive a reminder letter in the mail two months in advance. If you don't receive a letter, please call our office to schedule the follow-up appointment.   If you need a refill on your cardiac medications before your next appointment, please call your pharmacy.     Studies Ordered:   Orders Placed This Encounter  Procedures  . EKG 12-Lead      Bryan Lemma, M.D., M.S. Interventional Cardiologist   Pager # [phone] Phone # [phone] [address] [place], [place] [address]   Thank you for choosing Heartcare at [facility]!!

## 2017-10-21 NOTE — Patient Instructions (Signed)
NO CHANGE WITH MEDICATIONS   RESTART TAKING HCTZ -    Your physician wants you to follow-up in 12 MONTHS WITH DR HARDING.You will receive a reminder letter in the mail two months in advance. If you don't receive a letter, please call our office to schedule the follow-up appointment.   If you need a refill on your cardiac medications before your next appointment, please call your pharmacy.

## 2017-10-24 ENCOUNTER — Encounter: Payer: Self-pay | Admitting: Cardiology

## 2017-10-24 NOTE — Assessment & Plan Note (Signed)
Stable LVH with repolarization abnormalities noted.  I have given him a copy of this EKG to keep with him in both of his vehicles.

## 2017-10-24 NOTE — Assessment & Plan Note (Signed)
This may have a little bit doing potentially being dehydrated.  I do not hear as much of a murmur today.  He has had vigorous LV motion with some LVH which could cause an outflow tract murmur. Indicated the importance of adequate hydration to avoid exacerbation of this gradient.

## 2017-10-24 NOTE — Assessment & Plan Note (Signed)
Not as well-controlled today.  He has not been taking his HCTZ.  He will restart HCTZ.  We talked about the cost of Bystolic and the reasons for using it being to reduce the side effects.  Once he realized and understood the reasoning behind it, he agreed that that is something he would not want to change.  I mentioned that we can convert the Avapro and HCTZ to a combination medication however I think this may increase the cost.

## 2017-11-15 ENCOUNTER — Telehealth: Payer: Self-pay | Admitting: Cardiology

## 2017-11-15 DIAGNOSIS — Z79899 Other long term (current) drug therapy: Secondary | ICD-10-CM

## 2017-11-15 DIAGNOSIS — R634 Abnormal weight loss: Secondary | ICD-10-CM

## 2017-11-15 DIAGNOSIS — I10 Essential (primary) hypertension: Secondary | ICD-10-CM

## 2017-11-15 NOTE — Telephone Encounter (Signed)
OK - lets try converting to Spironolactone 25 mg. -> check Chem panel after 2 weeks.  Bryan Lemma, MD

## 2017-11-15 NOTE — Telephone Encounter (Signed)
Returned call to patient who reports he has lost 10 lbs since being here on 4/4.  He contributes this to HCTZ. He reports he is staying hydrated.  He is aware this is a diuretic but he states he is going to have to take something different because he cannot keep losing weight, requesting alternative.  Does not take BP at home, has not checked since appt.      Routed to Dr. Herbie Baltimore to review.

## 2017-11-15 NOTE — Telephone Encounter (Signed)
New Message:       Pt c/o medication issue:  1. Name of Medication: hydrochlorothiazide (HYDRODIURIL) 25 MG tablet  2. How are you currently taking this medication (dosage and times per day)? Take 1 tablet (25 mg total) by mouth daily.  3. Are you having a reaction (difficulty breathing--STAT)? No  4. What is your medication issue? Pt states he has been losing weight (10 lbs) since 4/5 and starting this medication.

## 2017-11-16 MED ORDER — SPIRONOLACTONE 25 MG PO TABS: 25.0000 mg | ORAL_TABLET | Freq: Every day | ORAL | 6 refills | Status: DC

## 2017-11-16 NOTE — Telephone Encounter (Signed)
Spoke to patient, aware of the change with medication .  Answered question . Aware to have labs done in 2 weeks after starting spironolactone.

## 2017-11-17 ENCOUNTER — Other Ambulatory Visit: Payer: Self-pay | Admitting: Cardiology

## 2017-11-18 ENCOUNTER — Telehealth: Payer: Self-pay | Admitting: Cardiology

## 2017-11-18 MED ORDER — NEBIVOLOL HCL 10 MG PO TABS: 20.0000 mg | ORAL_TABLET | Freq: Every day | ORAL | 11 refills | Status: DC

## 2017-11-18 NOTE — Telephone Encounter (Signed)
REFILL 

## 2017-11-18 NOTE — Telephone Encounter (Signed)
°*  STAT* If patient is at the pharmacy, call can be transferred to refill team.   1. Which medications need to be refilled? (please list name of each medication and dose if known)   nebivolol (BYSTOLIC) 10 MG tablet Take 2 tablets (20 mg total) by mouth daily.   2. Which pharmacy/location (including street and city if local pharmacy) is medication to be sent to?  [id number] [place], [place] - [address] [phone] (Phone) [phone] (Fax)   3. Do they need a 30 day or 90 day supply? 30

## 2018-02-18 ENCOUNTER — Telehealth: Payer: Self-pay | Admitting: Cardiology

## 2018-02-18 MED ORDER — VALSARTAN 320 MG PO TABS: 320.0000 mg | ORAL_TABLET | Freq: Every day | ORAL | 2 refills | Status: DC

## 2018-02-18 NOTE — Telephone Encounter (Signed)
Rx for valsartan 320mg  daily sent to prefer pharmacy.

## 2018-02-18 NOTE — Telephone Encounter (Signed)
New Message    .Pt c/o medication issue:  1. Name of Medication: irbesartan   2. How are you currently taking this medication (dosage and times per day)?   3. Are you having a reaction (difficulty breathing--STAT)?   4. What is your medication issue? Patient is out of his irbesartan he states that he has not taken this medication in over a month because the pharmacy indicates its out of stock. He would like to see if something else can be prescribed. Please call to discuss.

## 2018-05-26 ENCOUNTER — Other Ambulatory Visit: Payer: Self-pay | Admitting: Cardiology

## 2018-06-29 ENCOUNTER — Other Ambulatory Visit: Payer: Self-pay | Admitting: Cardiology

## 2018-07-06 ENCOUNTER — Telehealth: Payer: Self-pay | Admitting: Cardiology

## 2018-07-06 MED ORDER — VALSARTAN 160 MG PO TABS: 320.0000 mg | ORAL_TABLET | Freq: Every day | ORAL | 3 refills | Status: DC

## 2018-07-06 NOTE — Telephone Encounter (Signed)
New message   Pt c/o medication issue:  1. Name of Medication:valsartan (DIOVAN) 320 MG tablet  2. How are you currently taking this medication (dosage and times per day)? 1 time daily  3. Are you having a reaction (difficulty breathing--STAT)?no   4. What is your medication issue? [facility]. Please advise.

## 2018-07-06 NOTE — Telephone Encounter (Signed)
Not an accurate statement. Valsartan still been produce but [facility]. [facility] [address] [facility] 60 tabs of 160mg  (take 2 tables daily) if patient willing to pick up there. We can also try other pharmacist   Patient informed of pharm D response and agrees to [facility] [address].

## 2018-10-20 ENCOUNTER — Telehealth: Payer: Self-pay | Admitting: *Deleted

## 2018-10-20 NOTE — Telephone Encounter (Signed)
Left message to cal back to reschedule 4/9 appt with dr harding.

## 2018-10-24 MED ORDER — VALSARTAN 320 MG PO TABS: 320.0000 mg | ORAL_TABLET | Freq: Every day | ORAL | 2 refills | Status: DC

## 2018-10-24 NOTE — Telephone Encounter (Signed)
SPOKE TO PATIENT . PATIENT NEEDS RX FOR VALSARTAN  160 MG x 2  Daily ( total of 320 mg). RN contacted pharmacy -  Valsartan 320 mg available. Prescription change to reflect dosage needed. Patient is aware.       Primary Cardiologist:  Dr Bryan Lemma   Patient contacted.  History reviewed.  No symptoms to suggest any unstable cardiac conditions.  Based on discussion, with current pandemic situation, we will be postponing this appointment for Samuel Gutierrez with a plan for f/u in >12 wks or sooner if feasible/necessary.  If symptoms change, he has been instructed to contact our office.   Routing to C19 CANCEL pool for tracking   [name]  10/24/2018 9:28 AM         .

## 2018-10-27 ENCOUNTER — Ambulatory Visit: Payer: 59 | Admitting: Cardiology

## 2018-11-11 NOTE — Telephone Encounter (Signed)
   Spoke w/ pt, he is agreeable to coming in to see Dr Herbie Baltimore on 07/27 at 8:00 am.  Requests a reminder call before the appt. Will route this to [employer].  No other issues or concerns.  Theodore Demark, PA-C 11/11/2018 4:15 PM Beeper [phone]

## 2018-11-27 ENCOUNTER — Other Ambulatory Visit: Payer: Self-pay | Admitting: Cardiology

## 2018-11-28 NOTE — Telephone Encounter (Signed)
Bystolic 10 mg refilled. 

## 2019-01-25 ENCOUNTER — Other Ambulatory Visit: Payer: Self-pay | Admitting: Cardiology

## 2019-01-27 ENCOUNTER — Telehealth: Payer: Self-pay | Admitting: Cardiology

## 2019-01-27 MED ORDER — SPIRONOLACTONE 25 MG PO TABS: 25.0000 mg | ORAL_TABLET | Freq: Every day | ORAL | 6 refills | Status: DC

## 2019-01-27 NOTE — Telephone Encounter (Signed)
RX sent- patient notified to keep upcoming appointment.

## 2019-01-27 NOTE — Telephone Encounter (Signed)
New Message      *STAT* If patient is at the pharmacy, call can be transferred to refill team.   1. Which medications need to be refilled? (please list name of each medication and dose if known) Spironolactone 25mg  1 tablet daily  2. Which pharmacy/location (including street and city if local pharmacy) is medication to be sent to? [facility] [address]  3. Do they need a 30 day or 90 day supply? 30 day supply

## 2019-02-10 ENCOUNTER — Telehealth: Payer: Self-pay | Admitting: Cardiology

## 2019-02-10 NOTE — Telephone Encounter (Signed)

## 2019-02-13 ENCOUNTER — Ambulatory Visit: Payer: 59 | Admitting: Cardiology

## 2019-02-13 ENCOUNTER — Encounter: Payer: Self-pay | Admitting: Cardiology

## 2019-02-13 VITALS — BP 128/80 | HR 73 | Temp 97.7°F | Ht 72.0 in | Wt 232.4 lb

## 2019-02-13 DIAGNOSIS — I10 Essential (primary) hypertension: Secondary | ICD-10-CM

## 2019-02-13 DIAGNOSIS — R011 Cardiac murmur, unspecified: Secondary | ICD-10-CM

## 2019-02-13 DIAGNOSIS — R9431 Abnormal electrocardiogram [ECG] [EKG]: Secondary | ICD-10-CM

## 2019-02-13 DIAGNOSIS — E785 Hyperlipidemia, unspecified: Secondary | ICD-10-CM

## 2019-02-13 DIAGNOSIS — E1169 Type 2 diabetes mellitus with other specified complication: Secondary | ICD-10-CM | POA: Diagnosis not present

## 2019-02-13 NOTE — Assessment & Plan Note (Signed)
BP better today -- PCP has added Amlodipine to 20 mg Bystolic, 676 mg Valsartan & 25 mg Spironolactone.  With LVH - discussed importance of BP control to avoid long-term damage.

## 2019-02-13 NOTE — Patient Instructions (Signed)
Medication Instructions:  No changes  If you need a refill on your cardiac medications before your next appointment, please call your pharmacy.   Lab work: Not needed   Testing/Procedures: Not needed  Follow-Up: At [facility], you and your health needs are our priority.  As part of our continuing mission to provide you with exceptional heart care, we have created designated Provider Care Teams.  These Care Teams include your primary Cardiologist (physician) and Advanced Practice Providers (APPs -  Physician Assistants and Nurse Practitioners) who all work together to provide you with the care you need, when you need it. . You will need a follow up appointment in 12 months.  Please call our office 2 months in advance to schedule this appointment.  You may see Glenetta Hew, MD or one of the following Advanced Practice Providers on your designated Care Team:   . Rosaria Ferries, PA-C . Jory Sims, DNP, ANP  Any Other Special Instructions Will Be Listed Below (If Applicable).

## 2019-02-13 NOTE — Progress Notes (Signed)
PCP: Knox RoyaltyJones, Enrico, MD  Clinic Note: Chief Complaint  Patient presents with  . Follow-up    OD 12 month f/u no complaints today. Meds reviewed verbally with pt.  . Hypertension    HPI: Samuel Gutierrez is a 48 y.o. male with a PMH notable for hypertension and LVH who presents today for delayed annual follow-up.Samuel Gutierrez.  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 He was referred for an abnormal EKG that looks quite impressively like an anterior STEMI with anterior and inferolateral T-wave inversions. He has had a negative cardiac workup clinically and echo did not show any of LVH.  He was last seen on October 07, 2017 - doing well.  He acknowledges that he was not taking his medications as scheduled.  He was not taking his HCTZ routinely, nor was he taking his Bystolic, citing cost.  -->  Plan was to restart both  Recent Hospitalizations: n/a  Studies Personally Reviewed - (if available, images/films reviewed: From Epic Chart or Care Everywhere)  None  Interval History: Samuel NeedleMichael returns today doing pretty well.  He really does not have that much in the way of any complaints.  He apparently has been having medication adjustments switching from Januvia to Onglyza, and is now also on amlodipine that was started by his PCP.  His blood pressures have been doing fairly well.  He actually continues to stay quite active both working out with weights and another aerobic activities without any chest tightness or pressure.  He is also quite active at work with no complaints.  No chest pain or shortness of breath with rest or exertion.  No PND, orthopnea or edema.  No palpitations, lightheadedness, dizziness, weakness or syncope/near syncope. No TIA/amaurosis fugax symptoms. No claudication.  ROS: A comprehensive was performed. Review of Systems  Constitutional: Negative for chills, fever and malaise/fatigue.  HENT: Negative for congestion, nosebleeds and sinus pain.    Respiratory: Negative for cough and shortness of breath.   Cardiovascular: Negative for claudication.  Gastrointestinal: Negative for blood in stool and melena.  Genitourinary: Negative for hematuria.  Musculoskeletal: Negative.   Neurological: Negative for dizziness, weakness and headaches.  Psychiatric/Behavioral: Negative.   All other systems reviewed and are negative.  The patient does not have symptoms concerning for COVID-19 infection (fever, chills, cough, or new shortness of breath).  The patient is practicing social distancing.  He wears masks at work, and whenever he goes out in public.   COVID-19 Education: The signs and symptoms of COVID-19 were discussed with the patient and how to seek care for testing (follow up with PCP or arrange E-visit).   The importance of social distancing was discussed today.   I have reviewed and (if needed) personally updated the patient's problem list, medications, allergies, past medical and surgical history, social and family history.   Past Medical History:  Diagnosis Date  . Abnormal resting ECG findings    LVH with repolarization abnormalities.  . Benign essential HTN    Has a history of medical nonadherence  . LVH (left ventricular hypertrophy)    borderline    Past Surgical History:  Procedure Laterality Date  . TRANSTHORACIC ECHOCARDIOGRAM  10/2016   Normal LV size with vigorous motion.  EF 65-70%.  No dynamic obstruction, but hyperdynamic LV.  Increased flow velocities intracavity.  This is likely the cause of the murmur.  No evidence of valvular or subvalvular disease.  GR 1 DD    Current  Meds  Medication Sig  . amLODipine (NORVASC) 5 MG tablet Take 5 mg by mouth daily.  Marland Kitchen atorvastatin (LIPITOR) 40 MG tablet Take 40 mg by mouth daily.  Marland Kitchen BYSTOLIC 10 MG tablet TAKE 2 TABLETS BY MOUTH EVERY DAY  . multivitamin (ONE-A-DAY MEN'S) TABS Take 1 tablet by mouth daily.  Marland Kitchen omeprazole (PRILOSEC) 40 MG capsule Take 40 mg by mouth  daily.  . saxagliptin HCl (ONGLYZA) 5 MG TABS tablet Take by mouth daily.  Marland Kitchen spironolactone (ALDACTONE) 25 MG tablet Take 1 tablet (25 mg total) by mouth daily.  . valsartan (DIOVAN) 320 MG tablet Take 1 tablet (320 mg total) by mouth daily.    No Known Allergies  Social History   Tobacco Use  . Smoking status: Light Tobacco Smoker    Types: Cigars  . Smokeless tobacco: Never Used  . Tobacco comment: once month; maybe one cigar; cessation counseling not needed  Substance Use Topics  . Alcohol use: No  . 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 He is extremely fit and healthy doing routine weightlifting and cardiovascular exercises -- working out 5 days a week   He is currently single with 2 children -- his schedule is not amenable to relationship with the children's mother.   He does a knowledge of having occasional glass of wine socially. He also will have occasional me one cigar a month with his friends, but nothing significant.    family history includes Diabetes Mellitus II in his maternal grandmother and mother; Hypertension in his maternal grandmother and mother.  Wt Readings from Last 3 Encounters:  02/13/19 232 lb 6 oz (105.4 kg)  10/21/17 234 lb 12.8 oz (106.5 kg)  10/07/16 240 lb (108.9 kg)    PHYSICAL EXAM BP 128/80 (BP Location: Left Arm, Patient Position: Sitting, Cuff Size: Large)   Pulse 73   Temp 97.7 F (36.5 C)   Ht 6' (1.829 m)   Wt 232 lb 6 oz (105.4 kg)   SpO2 98%   BMI 31.52 kg/m  Physical Exam  Constitutional: He is oriented to person, place, and time. He appears well-developed and well-nourished. No distress.  Although his BMI is indicating him being obese, he is clearly not obese.  Very muscular and athletic appearing.  Well-groomed.  Healthy  HENT:  Head: Normocephalic and atraumatic.  Eyes: EOM are normal. No scleral icterus.  Neck: Normal range of  motion. Neck supple. No hepatojugular reflux and no JVD present. Carotid bruit is not present.  Cardiovascular: Normal rate, regular rhythm, intact distal pulses and normal pulses.  No extrasystoles are present. PMI is not displaced. Exam reveals no gallop and no friction rub.  Murmur heard.  Medium-pitched harsh crescendo-decrescendo early systolic murmur is present with a grade of 1/6 at the upper right sternal border radiating to the neck. Pulmonary/Chest: Effort normal and breath sounds normal. No respiratory distress. He has no wheezes. He has no rales.  Abdominal: Soft. Bowel sounds are normal. He exhibits no distension. There is no abdominal tenderness.  Musculoskeletal: Normal range of motion.        General: No edema.  Neurological: He is alert and oriented to person, place, and time.  Psychiatric: He has a normal mood and affect. His behavior is normal. Judgment and thought content normal.  Nursing note and vitals reviewed.    Adult ECG Report  Rate: 73;  Rhythm: normal sinus rhythm and LVH with repolarization of normalities.  Cannot exclude lateral ischemia.  Normal axis, intervals and durations;   Narrative Interpretation: Stable EKG --In the past we have provided him copies of this EKG to carry with him.   Other studies Reviewed: Additional studies/ records that were reviewed today include:  Recent Labs:  Checked by PCP -- started on statin, ? Related to blood sugar level   ASSESSMENT / PLAN: Problem List Items Addressed This Visit    Hyperlipidemia associated with type 2 diabetes mellitus (HCC) (Chronic)    Now on both atorvastatin & Onglyza -- labs f/u by PCP.  Discussed importance of maintaining close glycemic & lipid control to avoid CVD complications.      Relevant Medications   saxagliptin HCl (ONGLYZA) 5 MG TABS tablet   atorvastatin (LIPITOR) 40 MG tablet   Essential hypertension - Primary (Chronic)    BP better today -- PCP has added Amlodipine to 20 mg  Bystolic, 320 mg Valsartan & 25 mg Spironolactone.  With LVH - discussed importance of BP control to avoid long-term damage.      Relevant Medications   amLODipine (NORVASC) 5 MG tablet   atorvastatin (LIPITOR) 40 MG tablet   Other Relevant Orders   EKG 12-Lead (Completed)   Abnormal resting ECG findings (Chronic)    Stable EKG -- dramatic repolarization changes. Has copies to keep with him.      Relevant Orders   EKG 12-Lead (Completed)      I spent a total of 15minutes with the patient and chart review. >  50% of the time was spent in direct patient consultation.   Current medicines are reviewed at length with the patient today.  (+/- concerns)  --was just wondering if he could potentially come off medications.  Unfortunately would be unlikely since he is now relatively stable on current meds. Patient Instructions  Medication Instructions:  No changes  If you need a refill on your cardiac medications before your next appointment, please call your pharmacy.   Lab work: Not needed   Testing/Procedures: Not needed  Follow-Up: At [facility], you and your health needs are our priority.  As part of our continuing mission to provide you with exceptional heart care, we have created designated Provider Care Teams.  These Care Teams include your primary Cardiologist (physician) and Advanced Practice Providers (APPs -  Physician Assistants and Nurse Practitioners) who all work together to provide you with the care you need, when you need it. . You will need a follow up appointment in 12 months.  Please call our office 2 months in advance to schedule this appointment.  You may see Bryan Lemmaavid Andrian Sabala, MD or one of the following Advanced Practice Providers on your designated Care Team:   . Theodore DemarkRhonda Barrett, PA-C . Joni ReiningKathryn Lawrence, DNP, ANP  Any Other Special Instructions Will Be Listed Below (If Applicable).    Studies Ordered:   Orders Placed This Encounter  Procedures  . EKG  12-Lead      Bryan Lemmaavid Cohl Behrens, M.D., M.S. Interventional Cardiologist   Pager # [phone] Phone # [phone] [phone] [place], [place] [address]   Thank you for choosing [facility]!!

## 2019-02-13 NOTE — Assessment & Plan Note (Signed)
Stable EKG -- dramatic repolarization changes. Has copies to keep with him.

## 2019-02-13 NOTE — Assessment & Plan Note (Signed)
Now on both atorvastatin & Onglyza -- labs f/u by PCP.  Discussed importance of maintaining close glycemic & lipid control to avoid CVD complications.

## 2019-02-14 ENCOUNTER — Encounter: Payer: Self-pay | Admitting: Cardiology

## 2019-02-14 NOTE — Assessment & Plan Note (Signed)
Has been evaluated with echocardiogram.  Likely related to vigorous LV wall motion with some dynamic outflow tract turbulence.  Important to ensure adequate hydration.

## 2019-02-17 ENCOUNTER — Other Ambulatory Visit: Payer: Self-pay

## 2019-02-17 DIAGNOSIS — Z20822 Contact with and (suspected) exposure to covid-19: Secondary | ICD-10-CM

## 2019-02-19 LAB — NOVEL CORONAVIRUS, NAA: SARS-CoV-2, NAA: NOT DETECTED

## 2019-03-26 ENCOUNTER — Other Ambulatory Visit: Payer: Self-pay | Admitting: Cardiology

## 2019-07-06 ENCOUNTER — Other Ambulatory Visit: Payer: Self-pay | Admitting: Cardiology

## 2019-07-31 ENCOUNTER — Other Ambulatory Visit: Payer: Self-pay

## 2019-07-31 MED ORDER — NEBIVOLOL HCL 10 MG PO TABS: 20.0000 mg | ORAL_TABLET | Freq: Every day | ORAL | 10 refills | Status: DC

## 2019-08-21 ENCOUNTER — Telehealth: Payer: Self-pay | Admitting: Cardiology

## 2019-08-21 NOTE — Telephone Encounter (Signed)
Will route to primary nurse- possible PA for Brilinta.

## 2019-08-21 NOTE — Telephone Encounter (Signed)
New Message:     Pt says his insurance will no longer  Pay for his Brilinta. He wants Dr Herbie Baltimore to recommend another medicine medicine to take the place of Brilinta.

## 2019-08-25 ENCOUNTER — Other Ambulatory Visit: Payer: Self-pay | Admitting: Cardiology

## 2019-09-01 NOTE — Telephone Encounter (Signed)
Spoke to patient . Patient states his drug store  Informed him taht bystolic was not covered. rn will contact pharmacy to seeif prior authorization is needed  Patient is not on brilinta as earlier message states.   RN  SPOKE PHARMACIST -  BYSTOLIC IS UHC PLAN EXCLUSION   ALTERNATIVE WOULD BE  ATENOLOL BISOPROLOL OR METOPROLOL  PATIENT HAS BEEN ON MEDICATION SINCE DEC 31,2014 WILL TRY TO DO MED EXCEPTION

## 2019-09-08 ENCOUNTER — Ambulatory Visit: Payer: 59 | Attending: Internal Medicine

## 2019-09-08 DIAGNOSIS — Z23 Encounter for immunization: Secondary | ICD-10-CM | POA: Insufficient documentation

## 2019-09-08 NOTE — Progress Notes (Signed)
   Covid-19 Vaccination Clinic  Name:  Samuel Gutierrez    MRN: [id number] DOB: January 12, 1971  09/08/2019  [name] was observed post Covid-19 immunization for 15 minutes without incidence. He was provided with Vaccine Information Sheet and instruction to access the V-Safe system.   [name] was instructed to call 911 with any severe reactions post vaccine: Marland Kitchen Difficulty breathing  . Swelling of your face and throat  . A fast heartbeat  . A bad rash all over your body  . Dizziness and weakness    Immunizations Administered    Name Date Dose VIS Date Route   Pfizer COVID-19 Vaccine 09/08/2019 10:57 AM 0.3 mL 06/30/2019 Intramuscular   Manufacturer: [employer], [employer]   Lot: [id number]   NDC: [address]-4

## 2019-09-21 ENCOUNTER — Telehealth: Payer: Self-pay | Admitting: Cardiology

## 2019-09-21 NOTE — Telephone Encounter (Signed)
Spoke with patient. Patient's insurance no longer covers Bystolic. Patient would like bystolic changed to another medication. One week's worth of samples given to patient until medication can be changed. Will route to MD.

## 2019-09-21 NOTE — Telephone Encounter (Signed)
  Patient calling the office for samples of medication:   1.  What medication and dosage are you requesting samples for?nebivolol (BYSTOLIC) 10 MG tablet  2.  Are you currently out of this medication? Yes

## 2019-09-24 NOTE — Telephone Encounter (Signed)
This is a tough conversion to go from Bystolic 10 mg to a different medication.  Lets try starting off with carvedilol 12.5 mg tablets.  For the first week he should take 1/2 tablet twice daily and then increase to full tablet twice daily. -->  Should probably be scheduled to see APP / CVRR or me after couple weeks just to reassess blood pressure after conversion.  Bryan Lemma, MD c

## 2019-09-25 MED ORDER — CARVEDILOL 12.5 MG PO TABS: 12.5000 mg | ORAL_TABLET | Freq: Two times a day (BID) | ORAL | 3 refills | Status: DC

## 2019-09-25 NOTE — Telephone Encounter (Signed)
Spoke with patient. Informed patient Bystolic is being changed to Carvedilol 12.5mg  BID. Educated patient to take 1/2 tablet twice a day for the first week and then increase to 1 tablet twice a day after that. Patient verbalized understanding of medication instructions. New prescription is sent into preferred pharmacy. Message sent to scheduling for a 2-3 week follow up with the pharmacist.

## 2019-10-03 ENCOUNTER — Ambulatory Visit: Payer: 59 | Attending: Internal Medicine

## 2019-10-03 DIAGNOSIS — Z23 Encounter for immunization: Secondary | ICD-10-CM

## 2019-10-03 NOTE — Progress Notes (Signed)
   Covid-19 Vaccination Clinic  Name:  MACIEJ SCHWEITZER    MRN: [id number] DOB: March 17, 1971  10/03/2019  Mr. Trostle was observed post Covid-19 immunization for 15 minutes without incident. He was provided with Vaccine Information Sheet and instruction to access the V-Safe system.   Mr. Musick was instructed to call 911 with any severe reactions post vaccine: Marland Kitchen Difficulty breathing  . Swelling of face and throat  . A fast heartbeat  . A bad rash all over body  . Dizziness and weakness   Immunizations Administered    Name Date Dose VIS Date Route   Pfizer COVID-19 Vaccine 10/03/2019  8:00 AM 0.3 mL 06/30/2019 Intramuscular   Manufacturer: [employer], [employer]   Lot: [id number]   NDC: [address]-1

## 2019-10-09 ENCOUNTER — Telehealth: Payer: Self-pay | Admitting: Cardiology

## 2019-10-09 NOTE — Telephone Encounter (Signed)
Returned call to patient, he is wondering if there is a need to be on so many BP medications.   He would like to eliminate some if possible.    Advised sometimes it take multiple medications of difference classes to better control BP.  Advised to continue current meds and discuss with Dr. Herbie Baltimore further at next OV.  Patient agreed and verbalized understanding.

## 2019-10-09 NOTE — Telephone Encounter (Signed)
Patient states he is requesting to speak with Dr. Elissa Hefty nurse in regards to medication. Please return call to discuss.

## 2019-10-19 ENCOUNTER — Ambulatory Visit (INDEPENDENT_AMBULATORY_CARE_PROVIDER_SITE_OTHER): Payer: 59 | Admitting: Pharmacist Clinician (PhC)/ Clinical Pharmacy Specialist

## 2019-10-19 ENCOUNTER — Other Ambulatory Visit: Payer: Self-pay

## 2019-10-19 DIAGNOSIS — I10 Essential (primary) hypertension: Secondary | ICD-10-CM | POA: Diagnosis not present

## 2019-10-19 MED ORDER — AMLODIPINE BESYLATE-VALSARTAN 5-320 MG PO TABS: 1.0000 | ORAL_TABLET | Freq: Every day | ORAL | 3 refills | Status: DC

## 2019-10-19 NOTE — Assessment & Plan Note (Signed)
Patient with essential hypertension, currently controlled on 4 medications.  Patient concerned about high pill burden, but otherwise no complaints.  We can switch his valsartan and amlodipine into 1 tablet of Exforge, but otherwise, no changes to medications.  Explained different mechanisms of action and the purpose of taking this combination to keep him well controlled, as well as the concerns for the future should he not have as good control as he does now.  Answered all questions.  Advised to continue with home monitoring about 1-2 times per week, and should he notice an increase in either systolic or diastolic, he should let us know.

## 2019-10-19 NOTE — Progress Notes (Signed)
10/19/2019 Samuel Gutierrez 10-17-1970 [id number]   HPI:  Samuel Gutierrez is a 49 y.o. male patient of Dr Herbie Baltimore, with a PMH below who presents today for hypertension clinic evaluation.  In addition to hypertension, his medical history is significant for hyperlipidemia and diabetes.  He was seen by Dr. Herbie Baltimore last summer and was doing well on Bystolic, valsartan, amlodipine and spironolactone.  Unfortunately in the new year his insurance company would no longer cover Bystolic.  He was switched to carvedilol 6.25mg  bi x 1 week then 12.5 mg daily. He is here today for follow up to be sure his BP is still WNL.  Today Samuel Gutierrez is doing well.  He has been checking home BP readings a few times each month and they have all been WNL, both systolic and diastolic.  His bigger concern is the number of medications needed.  He would like to know if there is any way to consolidate or get off any of these meds.    Blood Pressure Goal:  130/80  Current Medications: valsartan 320 mg qd, amlodipine 5 mg qd, spironolactone 25 mg qd, carvedilol 12.5 mg bid  Family Hx: both parents still living, both with hypertension, but no other specific issues.  7 siblings (1 older, 6 younger), none with known hypertension; 2 children 30, 46 - neither with issues but aware to monitor.    Social Hx: smokes 3-4 cigars per month, drinks only occasional alcohol; coffee about 5 times per week ([employer])  Exercise: goes to gym regularly  Home BP readings: checks 3-4 times per month, states all are < 130/80, yesterday was 111/73  Intolerances: nkda  Labs: 08/2017:  Na 143, K 4.4, Glu 150, BUN 15, SCr 1.25   Wt Readings from Last 3 Encounters:  10/19/19 223 lb 9.6 oz (101.4 kg)  02/13/19 232 lb 6 oz (105.4 kg)  10/21/17 234 lb 12.8 oz (106.5 kg)   BP Readings from Last 3 Encounters:  10/19/19 128/88  02/13/19 128/80  10/21/17 (!) 142/102   Pulse Readings from Last 3 Encounters:  10/19/19 78  02/13/19 73  10/21/17  65    Current Outpatient Medications  Medication Sig Dispense Refill  . amLODipine (NORVASC) 5 MG tablet Take 5 mg by mouth daily.    Marland Kitchen atorvastatin (LIPITOR) 40 MG tablet Take 40 mg by mouth daily.    . carvedilol (COREG) 12.5 MG tablet Take 1 tablet (12.5 mg total) by mouth 2 (two) times daily. 180 tablet 3  . multivitamin (ONE-A-DAY MEN'S) TABS Take 1 tablet by mouth daily.    Marland Kitchen omeprazole (PRILOSEC) 40 MG capsule Take 40 mg by mouth daily.    . saxagliptin HCl (ONGLYZA) 5 MG TABS tablet Take by mouth daily.    Marland Kitchen spironolactone (ALDACTONE) 25 MG tablet TAKE 1 TABLET BY MOUTH EVERY DAY 90 tablet 3  . valsartan (DIOVAN) 320 MG tablet TAKE 1 TABLET BY MOUTH EVERY DAY 30 tablet 23  . amLODipine-valsartan (EXFORGE) 5-320 MG tablet Take 1 tablet by mouth daily. 90 tablet 3   No current facility-administered medications for this visit.    No Known Allergies  Past Medical History:  Diagnosis Date  . Abnormal resting ECG findings    LVH with repolarization abnormalities.  . Benign essential HTN    Has a history of medical nonadherence  . LVH (left ventricular hypertrophy)    borderline    Blood pressure 128/88, pulse 78, resp. rate 15, height 6' (1.829 m), weight 223  lb 9.6 oz (101.4 kg), SpO2 99 %.  Essential hypertension Patient with essential hypertension, currently controlled on 4 medications.  Patient concerned about high pill burden, but otherwise no complaints.  We can switch his valsartan and amlodipine into 1 tablet of Exforge, but otherwise, no changes to medications.  Explained different mechanisms of action and the purpose of taking this combination to keep him well controlled, as well as the concerns for the future should he not have as good control as he does now.  Answered all questions.  Advised to continue with home monitoring about 1-2 times per week, and should he notice an increase in either systolic or diastolic, he should let us know.     Tommy Medal PharmD CPP  [facility] [address] [place], [place] [address] [phone]

## 2019-10-19 NOTE — Patient Instructions (Signed)
  Check your blood pressure at home once or twice a week and keep record of the readings.  Take your BP meds as follows:  I will send a prescription for the valsartan/amlodipine combination tablet.    Continue with all other medications  Bring all of your meds, your BP cuff and your record of home blood pressures to your next appointment.  Exercise as you're able, try to walk approximately 30 minutes per day.  Keep salt intake to a minimum, especially watch canned and prepared boxed foods.  Eat more fresh fruits and vegetables and fewer canned items.  Avoid eating in fast food restaurants.    HOW TO TAKE YOUR BLOOD PRESSURE: . Rest 5 minutes before taking your blood pressure. .  Don't smoke or drink caffeinated beverages for at least 30 minutes before. . Take your blood pressure before (not after) you eat. . Sit comfortably with your back supported and both feet on the floor (don't cross your legs). . Elevate your arm to heart level on a table or a desk. . Use the proper sized cuff. It should fit smoothly and snugly around your bare upper arm. There should be enough room to slip a fingertip under the cuff. The bottom edge of the cuff should be 1 inch above the crease of the elbow. . Ideally, take 3 measurements at one sitting and record the average.

## 2020-04-11 ENCOUNTER — Ambulatory Visit: Payer: 59 | Admitting: Cardiology

## 2020-07-02 ENCOUNTER — Other Ambulatory Visit: Payer: Self-pay

## 2020-07-02 ENCOUNTER — Ambulatory Visit: Payer: 59 | Admitting: Cardiology

## 2020-07-02 VITALS — BP 142/78 | HR 69 | Ht 72.0 in | Wt 219.0 lb

## 2020-07-02 DIAGNOSIS — R9431 Abnormal electrocardiogram [ECG] [EKG]: Secondary | ICD-10-CM

## 2020-07-02 DIAGNOSIS — R002 Palpitations: Secondary | ICD-10-CM

## 2020-07-02 DIAGNOSIS — E1169 Type 2 diabetes mellitus with other specified complication: Secondary | ICD-10-CM

## 2020-07-02 DIAGNOSIS — I10 Essential (primary) hypertension: Secondary | ICD-10-CM | POA: Diagnosis not present

## 2020-07-02 DIAGNOSIS — E785 Hyperlipidemia, unspecified: Secondary | ICD-10-CM

## 2020-07-02 MED ORDER — SPIRONOLACTONE 25 MG PO TABS: 25.0000 mg | ORAL_TABLET | Freq: Every day | ORAL | 3 refills | Status: DC

## 2020-07-02 MED ORDER — ATORVASTATIN CALCIUM 40 MG PO TABS: 40.0000 mg | ORAL_TABLET | Freq: Every day | ORAL | 3 refills | Status: DC

## 2020-07-02 NOTE — Progress Notes (Signed)
Primary Care Provider: Knox Royalty, MD Cardiologist: No primary care provider on file. Electrophysiologist: None  Clinic Note: Chief Complaint  Patient presents with  . Hypertension    Intermittently taking his medications. Currently not taking carvedilol   Problem List Items Addressed This Visit    Essential hypertension (Chronic)   Abnormal resting ECG findings - Primary (Chronic)   Hyperlipidemia associated with type 2 diabetes mellitus (HCC) (Chronic)    HPI:    Samuel Gutierrez is a 49 y.o. male with a PMH notable for Hypertension and Hyperlipidemia, and DM-2 with a grossly abnormal EKG (Anterior and Inferolateral T wave inversions with Anterior ST Elevations that would suggest LVH, however echo did not show LVH), who presents today for delayed annual follow-up.  Samuel Gutierrez was last seen on February 13, 2019.  Was doing well.  Blood pressures been doing well.  No symptoms.Marland Kitchen  He was seen on October 19, 2019 by Phillips Hay, [facility]- CCP was part of the hypertension clinic.  Unfortunately his insurance company would no longer cover Bystolic and so we had to switch him to carvedilol.  He was able to titrate up to 12.5 mg twice daily in addition to his other medications.  He was concerned about high pill burden.  His valsartan and amlodipine doses were combined to Exforge.  Recent Hospitalizations: None  Reviewed  CV studies:    The following studies were reviewed today: (if available, images/films reviewed: From Epic Chart or Care Everywhere) . None:   Interval History:   Samuel Gutierrez returns today for routine follow-up stating that he is doing fairly well. He says his blood pressures are usually better than they are controlled daily. He has not been taking his carvedilol for the last several weeks, indicating that he felt like he was taken to any medicines. He was sick in June with GI discomfort and was found to have H. pylori. During that treatment time he was not feeling  very well and did not want take medications.  He says that energy drinks and acidic foods/drinks make him tachycardic. Otherwise he denies any real cardiac symptoms.  CV Review of Symptoms (Summary) Cardiovascular ROS: no chest pain or dyspnea on exertion positive for - palpitations, rapid heart rate and this is usually associated with certain foods or Energy Drinks negative for - edema, irregular heartbeat, orthopnea, paroxysmal nocturnal dyspnea, shortness of breath or syncope/near syncope; TIA/amaurosis fugax.  claudication  The patient does not have symptoms concerning for COVID-19 infection (fever, chills, cough, or new shortness of breath).   REVIEWED OF SYSTEMS   Review of Systems  Constitutional: Positive for weight loss (He lost quite a bit during his illness in June.). Negative for malaise/fatigue.  HENT: Negative for congestion and nosebleeds.   Respiratory: Negative for cough and shortness of breath.   Gastrointestinal: Negative for abdominal pain, blood in stool and melena.       GI symptoms seem to improved since taking his H. pylori medications.  Genitourinary: Negative for hematuria.  Musculoskeletal: Negative for joint pain and myalgias.  Neurological: Negative for dizziness and headaches.  Psychiatric/Behavioral: Negative for depression and memory loss. The patient is not nervous/anxious and does not have insomnia.     I have reviewed and (if needed) personally updated the patient's problem list, medications, allergies, past medical and surgical history, social and family history.   PAST MEDICAL HISTORY   Past Medical History:  Diagnosis Date  . Abnormal resting ECG findings  LVH with repolarization abnormalities.  . Benign essential HTN    Has a history of medical nonadherence  . LVH (left ventricular hypertrophy)    borderline    PAST SURGICAL HISTORY   Past Surgical History:  Procedure Laterality Date  . TRANSTHORACIC ECHOCARDIOGRAM  10/2016    Normal LV size with vigorous motion.  EF 65-70%.  No dynamic obstruction, but hyperdynamic LV.  Increased flow velocities intracavity.  This is likely the cause of the murmur.  No evidence of valvular or subvalvular disease.  GR 1 DD    Immunization History  Administered Date(s) Administered  . PFIZER SARS-COV-2 Vaccination 09/08/2019, 10/03/2019    MEDICATIONS/ALLERGIES   Current Meds  Medication Sig  . amLODipine-valsartan (EXFORGE) 5-320 MG tablet Take 1 tablet by mouth daily.  Marland Kitchen esomeprazole (NEXIUM) 40 MG capsule   . multivitamin (ONE-A-DAY MEN'S) TABS Take 1 tablet by mouth daily.  . saxagliptin HCl (ONGLYZA) 5 MG TABS tablet Take by mouth daily.  . Vitamin D, Ergocalciferol, (DRISDOL) 1.25 MG (50000 UNIT) CAPS capsule Take 50,000 Units by mouth once a week.  . [DISCONTINUED] atorvastatin (LIPITOR) 40 MG tablet Take 40 mg by mouth daily.  . [DISCONTINUED] spironolactone (ALDACTONE) 25 MG tablet TAKE 1 TABLET BY MOUTH EVERY DAY    No Known Allergies  SOCIAL HISTORY/FAMILY HISTORY   Reviewed in Epic:  Pertinent findings:  Social History   Tobacco Use  . Smoking status: Light Tobacco Smoker    Types: Cigars  . Smokeless tobacco: Never Used  . Tobacco comment: once month; maybe one cigar; cessation counseling not needed  Substance Use Topics  . Alcohol use: No  . 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   He does a knowledge of having occasional glass of wine socially. He also will have occasional me one cigar a month with his friends, but nothing significant.   Family History  Problem Relation Age of Onset  . Hypertension Mother        2 Maternal  uncles also HTN & DM  . Diabetes Mellitus II Mother   . Hypertension Maternal Grandmother   . Diabetes Mellitus II Maternal Grandmother     OBJCTIVE -PE, EKG, labs   Wt Readings from Last 3 Encounters:  07/02/20 219 lb (99.3 kg)  10/19/19 223 lb 9.6 oz (101.4 kg)  02/13/19 232 lb 6 oz (105.4 kg)    Physical Exam: BP (!) 142/78   Pulse 69   Ht 6' (1.829 m)   Wt 219 lb (99.3 kg)   SpO2 99%   BMI 29.70 kg/m  Physical Exam Vitals reviewed.  Constitutional:      General: He is not in acute distress.    Appearance: Normal appearance. He is not ill-appearing or toxic-appearing.     Comments: Very healthy, muscular/athletic appearing gentleman.  Well-groomed.  Weight was that he is obese, but he is clearly not obese  HENT:     Head: Normocephalic and atraumatic.  Neck:     Vascular: No carotid bruit, hepatojugular reflux or JVD.  Cardiovascular:     Rate and Rhythm: Normal rate and regular rhythm.  No extrasystoles are present.  Chest Wall: PMI is not displaced.     Pulses: Intact distal pulses.     Heart sounds: S1 normal and S2 normal. Murmur heard.   Medium-pitched harsh crescendo-decrescendo early systolic murmur is present at the upper right sternal border. No friction rub. No gallop.   Pulmonary:     Effort: Pulmonary effort is normal. No respiratory distress.     Breath sounds: Normal breath sounds.  Chest:     Chest wall: No tenderness.  Musculoskeletal:        General: No swelling. Normal range of motion.     Cervical back: Normal range of motion and neck supple.  Skin:    General: Skin is warm and dry.  Neurological:     General: No focal deficit present.     Mental Status: He is alert and oriented to person, place, and time. Mental status is at baseline.  Psychiatric:        Mood and Affect: Mood normal.        Behavior: Behavior normal.        Thought Content: Thought content normal.        Judgment: Judgment normal.       Adult ECG Report  Rate: 69  ;  Rhythm: normal sinus rhythm and LVH with repolarization changes, classic inferior Micah major impairment. Difficult because of repolarization changes.;   Narrative Interpretation: Stable EKG. Grossly abnormal.  Recent Labs: He was just checked by PCP back in November or early December. Not available.. No results found for: CHOL, HDL, LDLCALC, LDLDIRECT, TRIG, CHOLHDL No results found for: CREATININE, BUN, NA, K, CL, CO2 No results found for: TSH  ASSESSMENT/PLAN    Problem List Items Addressed This Visit    Essential hypertension - Primary (Chronic)    His blood pressure seems to be pretty well controlled on Exforge plus spironolactone. He is not currently on carvedilol-indicated it is too hard for him to take twice daily medications.  For now, as long as his palpitations seem to be under control and his blood pressure is doing okay, we can hold beta-blocker. Otherwise, we will probably try long-acting medication such as atenolol which is better for blood pressure than metoprolol.      Relevant Medications   atorvastatin (LIPITOR) 40 MG tablet   spironolactone (ALDACTONE) 25 MG tablet   Abnormal resting ECG findings (Chronic)    Stable EKG with significant repolarization changes of LVH. He carries a copy with him to ensure that no misinterpretation is made.  Interestingly, by echo, he does not have significant LVH.      Relevant Orders   EKG 12-Lead (Completed)   Hyperlipidemia associated with type 2 diabetes mellitus (HCC) (Chronic)    Atorvastatin & Onglyza - labs followed by PCP - not available.   Per pt - under control       Relevant Medications   atorvastatin (LIPITOR) 40 MG tablet   Intermittent palpitations    Currently not on Beta Blocker (never started taking Carvedilol after changing from Bystolic).  If Sx become concerning - start Atenolol 25 mg.       Relevant Orders   EKG 12-Lead (Completed)       COVID-19 Education: The signs and symptoms of COVID-19  were discussed with the patient and how to seek care for testing (follow up with PCP or arrange E-visit).   The importance of social distancing and COVID-19 vaccination was discussed today. 1 min The patient is practicing social distancing & Masking.  I spent a total of  26 minutes with the patient spent in direct patient consultation.  Additional time spent with chart review  / charting (studies, outside notes, etc): 8 Total Time: 34 min   Current medicines are reviewed at length with the patient today.  (+/- concerns) n/a  This visit occurred during the SARS-CoV-2 public health emergency.  Safety protocols were in place, including screening questions prior to the visit, additional usage of staff PPE, and extensive cleaning of exam room while observing appropriate contact time as indicated for disinfecting solutions.  Notice: This dictation was prepared with Dragon dictation along with smaller phrase technology. Any transcriptional errors that result from this process are unintentional and may not be corrected upon review.  Patient Instructions / Medication Changes & Studies & Tests Ordered   Patient Instructions  Medication Instructions:  No changes  Monitor fr palpitations  If the increase call and we will start medication Atenolol  *If you need a refill on your cardiac medications before your next appointment, please call your pharmacy*   Lab Work:  Not needed- will get labs from primary    Testing/Procedures:  Not needed  Follow-Up: At [facility], you and your health needs are our priority.  As part of our continuing mission to provide you with exceptional heart care, we have created designated Provider Care Teams.  These Care Teams include your primary Cardiologist (physician) and Advanced Practice Providers (APPs -  Physician Assistants and Nurse Practitioners) who all work together to provide you with the care you need, when you need it.     Your next appointment:    1 year(s)  The format for your next appointment:   In Person  Provider:   Bryan Lemma, MD   Other Instructions Monitor fr palpitations  If the increase call and we will start medication Atenolol     Studies Ordered:   Orders Placed This Encounter  Procedures  . EKG 12-Lead     Bryan Lemma, M.D., M.S. Interventional Cardiologist   Pager # [phone] Phone # [phone] [address] [place], [place] [address]   Thank you for choosing Heartcare at [facility]!!

## 2020-07-02 NOTE — Patient Instructions (Addendum)
Medication Instructions:  No changes  Monitor fr palpitations  If the increase call and we will start medication Atenolol  *If you need a refill on your cardiac medications before your next appointment, please call your pharmacy*   Lab Work:  Not needed- will get labs from primary    Testing/Procedures:  Not needed  Follow-Up: At [facility], you and your health needs are our priority.  As part of our continuing mission to provide you with exceptional heart care, we have created designated Provider Care Teams.  These Care Teams include your primary Cardiologist (physician) and Advanced Practice Providers (APPs -  Physician Assistants and Nurse Practitioners) who all work together to provide you with the care you need, when you need it.     Your next appointment:   1 year(s)  The format for your next appointment:   In Person  Provider:   Bryan Lemma, MD   Other Instructions Monitor fr palpitations  If the increase call and we will start medication Atenolol

## 2020-07-02 NOTE — Assessment & Plan Note (Signed)
Currently not on Beta Blocker (never started taking Carvedilol after changing from Bystolic).  If Sx become concerning - start Atenolol 25 mg.

## 2020-07-02 NOTE — Assessment & Plan Note (Signed)
Atorvastatin & Onglyza - labs followed by PCP - not available.   Per pt - under control

## 2020-07-16 ENCOUNTER — Encounter: Payer: Self-pay | Admitting: Cardiology

## 2020-07-16 NOTE — Assessment & Plan Note (Addendum)
Stable EKG with significant repolarization changes of LVH. He carries a copy with him to ensure that no misinterpretation is made.  Interestingly, by echo, he does not have significant LVH.

## 2020-07-16 NOTE — Assessment & Plan Note (Signed)
His blood pressure seems to be pretty well controlled on Exforge plus spironolactone. He is not currently on carvedilol-indicated it is too hard for him to take twice daily medications.  For now, as long as his palpitations seem to be under control and his blood pressure is doing okay, we can hold beta-blocker. Otherwise, we will probably try long-acting medication such as atenolol which is better for blood pressure than metoprolol.

## 2020-08-23 ENCOUNTER — Emergency Department (HOSPITAL_COMMUNITY)
Admission: EM | Admit: 2020-08-23 | Discharge: 2020-08-24 | Disposition: A | Discharge status: Home / Self Care | Payer: 59 | Attending: Emergency Medicine | Admitting: Emergency Medicine

## 2020-08-23 ENCOUNTER — Other Ambulatory Visit: Payer: Self-pay | Admitting: Geriatric Medicine

## 2020-08-23 ENCOUNTER — Other Ambulatory Visit: Payer: Self-pay

## 2020-08-23 DIAGNOSIS — E1169 Type 2 diabetes mellitus with other specified complication: Secondary | ICD-10-CM | POA: Insufficient documentation

## 2020-08-23 DIAGNOSIS — I10 Essential (primary) hypertension: Secondary | ICD-10-CM | POA: Insufficient documentation

## 2020-08-23 DIAGNOSIS — Z7984 Long term (current) use of oral hypoglycemic drugs: Secondary | ICD-10-CM | POA: Insufficient documentation

## 2020-08-23 DIAGNOSIS — Z79899 Other long term (current) drug therapy: Secondary | ICD-10-CM | POA: Diagnosis not present

## 2020-08-23 DIAGNOSIS — K5904 Chronic idiopathic constipation: Secondary | ICD-10-CM | POA: Insufficient documentation

## 2020-08-23 DIAGNOSIS — F17290 Nicotine dependence, other tobacco product, uncomplicated: Secondary | ICD-10-CM | POA: Diagnosis not present

## 2020-08-23 DIAGNOSIS — E785 Hyperlipidemia, unspecified: Secondary | ICD-10-CM | POA: Insufficient documentation

## 2020-08-23 DIAGNOSIS — R634 Abnormal weight loss: Secondary | ICD-10-CM | POA: Diagnosis not present

## 2020-08-23 DIAGNOSIS — R110 Nausea: Secondary | ICD-10-CM

## 2020-08-23 DIAGNOSIS — R140 Abdominal distension (gaseous): Secondary | ICD-10-CM

## 2020-08-23 DIAGNOSIS — R1013 Epigastric pain: Secondary | ICD-10-CM | POA: Diagnosis present

## 2020-08-23 DIAGNOSIS — R1010 Upper abdominal pain, unspecified: Secondary | ICD-10-CM

## 2020-08-23 DIAGNOSIS — K5909 Other constipation: Secondary | ICD-10-CM

## 2020-08-23 LAB — CBC
HCT: 51.1 % (ref 39.0–52.0)
Hemoglobin: 17 g/dL (ref 13.0–17.0)
MCH: 29 pg (ref 26.0–34.0)
MCHC: 33.3 g/dL (ref 30.0–36.0)
MCV: 87.1 fL (ref 80.0–100.0)
Platelets: 257 10*3/uL (ref 150–400)
RBC: 5.87 MIL/uL — ABNORMAL HIGH (ref 4.22–5.81)
RDW: 11.5 % (ref 11.5–15.5)
WBC: 8.9 10*3/uL (ref 4.0–10.5)
nRBC: 0 % (ref 0.0–0.2)

## 2020-08-23 LAB — COMPREHENSIVE METABOLIC PANEL
ALT: 26 U/L (ref 0–44)
AST: 20 U/L (ref 15–41)
Albumin: 3.9 g/dL (ref 3.5–5.0)
Alkaline Phosphatase: 74 U/L (ref 38–126)
Anion gap: 12 (ref 5–15)
BUN: 20 mg/dL (ref 6–20)
CO2: 24 mmol/L (ref 22–32)
Calcium: 9.3 mg/dL (ref 8.9–10.3)
Chloride: 99 mmol/L (ref 98–111)
Creatinine, Ser: 1.4 mg/dL — ABNORMAL HIGH (ref 0.61–1.24)
GFR, Estimated: >60 mL/min (ref >60)
Glucose, Bld: 154 mg/dL — ABNORMAL HIGH (ref 70–99)
Potassium: 3.6 mmol/L (ref 3.5–5.1)
Sodium: 135 mmol/L (ref 135–145)
Total Bilirubin: 1.2 mg/dL (ref 0.3–1.2)
Total Protein: 7 g/dL (ref 6.5–8.1)

## 2020-08-23 LAB — LIPASE, BLOOD: Lipase: 33 U/L (ref 11–51)

## 2020-08-23 MED ORDER — ONDANSETRON 4 MG PO TBDP
4.0000 mg | ORAL_TABLET | Freq: Once | ORAL | Status: AC | PRN
  Administered 2020-08-23: 4 mg via ORAL
  Filled 2020-08-23: qty 1

## 2020-08-23 NOTE — ED Triage Notes (Signed)
Pt presents to ED POV. Pt c/o LQ abd pain. Pt reports that pain began Sunday after eating chicken. Pt states he has not had bowel movement since Sunday. C/o bloating in abd, tried OTC laxative w/o relief. Reports pain is dull and 5/10

## 2020-08-24 ENCOUNTER — Emergency Department (HOSPITAL_COMMUNITY): Payer: 59

## 2020-08-24 LAB — URINALYSIS, ROUTINE W REFLEX MICROSCOPIC
Bacteria, UA: NONE SEEN
Bilirubin Urine: NEGATIVE
Glucose, UA: NEGATIVE mg/dL
Ketones, ur: NEGATIVE mg/dL
Leukocytes,Ua: NEGATIVE
Nitrite: NEGATIVE
Protein, ur: NEGATIVE mg/dL
Specific Gravity, Urine: 1.032 — ABNORMAL HIGH (ref 1.005–1.030)
pH: 5 (ref 5.0–8.0)

## 2020-08-24 MED ORDER — IOHEXOL 300 MG/ML  SOLN
100.0000 mL | Freq: Once | INTRAMUSCULAR | Status: AC | PRN
  Administered 2020-08-24: 100 mL via INTRAVENOUS

## 2020-08-24 MED ORDER — MORPHINE SULFATE (PF) 4 MG/ML IV SOLN
4.0000 mg | Freq: Once | INTRAVENOUS | Status: AC
  Administered 2020-08-24: 4 mg via INTRAVENOUS
  Filled 2020-08-24: qty 1

## 2020-08-24 MED ORDER — ONDANSETRON 4 MG PO TBDP: 4.0000 mg | ORAL_TABLET | Freq: Three times a day (TID) | ORAL | 0 refills | Status: DC | PRN

## 2020-08-24 NOTE — ED Provider Notes (Addendum)
[facility] EMERGENCY DEPARTMENT Provider Note   CSN: [id number] Arrival date & time: 08/23/20  1507     History Chief Complaint  Patient presents with  . Abdominal Pain    Samuel SEIER Sr. is a 50 y.o. male with history of HTN, abnormal EKG, HLD, gastritis and H. Pylori x 2 presents to ER for evaluation of nausea, abdominal pain and constipation. Ongoing for the last 6 weeks. Constant severe nausea without vomiting. Upper epigastric abdominal pain with that is throbbing and burning, constant much worse as soon as "food touching the top of my stomach". Non radiating, non exertional non pleuritic. Currently 7-8/10.  Had similar symptoms back in June when he had H. Pylori for the second time.  Has been to his PCP and gastroenterology for this. They "keep sending him out in the streets" with pain and not doing anything. Went to his GI doctor Dr Gwendalyn Ege [facility]. States she didn't want to do anything so he walked out and told her he was coming to the ER.  She wanted to do an EGD again this coming Monday.  Had EGD and colonoscopy 1 year ago. Was told he had "inflammation" in his stomach. No ulcers. Normal colonoscopy. Chart shows Dr Thornton Dales ordered CT AP and complete abdominal US. Patient was not aware of this, states she probably ordered this after he walked out of her clinic yesterday. Has not been taking omeprazole because he doesn't feel like it helps.  Has not eaten anything in the last 6 days due to pain. Has been keeping fluids down.  No BM in the last 7 days.  Passing "a lot" of gas.  Has lost 15-20 lb in the last 1 week due to pain.  No alcohol use. No NSAID use. No history of ulcers, SBO, pancreatitis. Patient on cardiac monitor shows TWI in inferior leads with questionable STE.  When asked about this patient states "don't worry about that that's not new". States that's not why he is here.   No fever, CP, SOB, vomiting. Last BM 1 week ago, no melena or blood noted in last BM.  No  abdominal surgeries.   HPI     Past Medical History:  Diagnosis Date  . Abnormal resting ECG findings    LVH with repolarization abnormalities.  . Benign essential HTN    Has a history of medical nonadherence  . LVH (left ventricular hypertrophy)    borderline    Patient Active Problem List   Diagnosis Date Noted  . Intermittent palpitations 07/02/2020  . Hyperlipidemia associated with type 2 diabetes mellitus (HCC) 02/13/2019  . Heart murmur 10/07/2016  . Essential hypertension   . Abnormal resting ECG findings     Past Surgical History:  Procedure Laterality Date  . TRANSTHORACIC ECHOCARDIOGRAM  10/2016   Normal LV size with vigorous motion.  EF 65-70%.  No dynamic obstruction, but hyperdynamic LV.  Increased flow velocities intracavity.  This is likely the cause of the murmur.  No evidence of valvular or subvalvular disease.  GR 1 DD       Family History  Problem Relation Age of Onset  . Hypertension Mother        2 Maternal uncles also HTN & DM  . Diabetes Mellitus II Mother   . Hypertension Maternal Grandmother   . Diabetes Mellitus II Maternal Grandmother     Social History   Tobacco Use  . Smoking status: Light Tobacco Smoker    Types: Cigars  .  Smokeless tobacco: Never Used  . Tobacco comment: once month; maybe one cigar; cessation counseling not needed  Substance Use Topics  . Alcohol use: No  . Drug use: No    Home Medications Prior to Admission medications   Medication Sig Start Date End Date Taking? Authorizing Provider  amLODipine-valsartan (EXFORGE) 5-320 MG tablet Take 1 tablet by mouth daily. 10/19/19  Yes Marykay Lex, MD  atorvastatin (LIPITOR) 40 MG tablet Take 1 tablet (40 mg total) by mouth daily. 07/02/20  Yes Marykay Lex, MD  multivitamin (ONE-A-DAY MEN'S) TABS Take 1 tablet by mouth daily.   Yes [provider]  ondansetron (ZOFRAN-ODT) 4 MG disintegrating tablet Take 1 tablet (4 mg total) by mouth every 8 (eight)  hours as needed for nausea or vomiting. 08/24/20  Yes Sharen Heck J, PA-C  saxagliptin HCl (ONGLYZA) 5 MG TABS tablet Take by mouth daily.   Yes [provider]  Semaglutide 14 MG TABS Take 14 mg by mouth daily.   Yes [provider]  spironolactone (ALDACTONE) 25 MG tablet Take 1 tablet (25 mg total) by mouth daily. 07/02/20  Yes Marykay Lex, MD  sucralfate (CARAFATE) 1 g tablet Take 1 g by mouth 4 (four) times daily.   Yes [provider]  Vitamin D, Ergocalciferol, (DRISDOL) 1.25 MG (50000 UNIT) CAPS capsule Take 50,000 Units by mouth once a week. 06/10/20  Yes [provider]    Allergies    Patient has no known allergies.  Review of Systems   Review of Systems  Gastrointestinal: Positive for abdominal pain, constipation and nausea.  All other systems reviewed and are negative.   Physical Exam Updated Vital Signs BP 135/90   Pulse 69   Temp 98.3 F (36.8 C)   Resp 15   SpO2 99%   Physical Exam Vitals and nursing note reviewed.  Constitutional:      Appearance: He is well-developed.     Comments: Non toxic.  HENT:     Head: Normocephalic and atraumatic.     Nose: Nose normal.  Eyes:     Conjunctiva/sclera: Conjunctivae normal.  Cardiovascular:     Rate and Rhythm: Normal rate and regular rhythm.     Pulses:          Radial pulses are 1+ on the right side and 1+ on the left side.       Dorsalis pedis pulses are 1+ on the right side and 1+ on the left side.     Heart sounds: Normal heart sounds.  Pulmonary:     Effort: Pulmonary effort is normal.     Breath sounds: Normal breath sounds.  Abdominal:     General: Bowel sounds are normal.     Palpations: Abdomen is soft.     Tenderness: There is abdominal tenderness in the epigastric area and left upper quadrant.     Comments: No G/R/R. No suprapubic or CVA tenderness. Negative Murphy's and McBurney's. No obvious distention   Musculoskeletal:        General: Normal range of  motion.     Cervical back: Normal range of motion.  Skin:    General: Skin is warm and dry.     Capillary Refill: Capillary refill takes less than 2 seconds.  Neurological:     Mental Status: He is alert.     Comments: Sensation to light touch and strength intact in UE and LE   Psychiatric:        Behavior: Behavior  normal.     ED Results / Procedures / Treatments   Labs (all labs ordered are listed, but only abnormal results are displayed) Labs Reviewed  COMPREHENSIVE METABOLIC PANEL - Abnormal; Notable for the following components:      Result Value   Glucose, Bld 154 (*)    Creatinine, Ser 1.40 (*)    All other components within normal limits  CBC - Abnormal; Notable for the following components:   RBC 5.87 (*)    All other components within normal limits  URINALYSIS, ROUTINE W REFLEX MICROSCOPIC - Abnormal; Notable for the following components:   APPearance HAZY (*)    Specific Gravity, Urine 1.032 (*)    Hgb urine dipstick SMALL (*)    All other components within normal limits  LIPASE, BLOOD    EKG EKG Interpretation  Date/Time:  Saturday August 24 2020 12:11:26 EST Ventricular Rate:  75 PR Interval:    QRS Duration: 103 QT Interval:  398 QTC Calculation: 445 R Axis:   71 Text Interpretation: Sinus rhythm Consider right atrial enlargement Repol abnrm, global ischemia, diffuse leads Borderline ST elevation, anterolateral leads no significant change since Dec 2021 Confirmed by Pricilla Loveless [phone]) on 08/24/2020 12:13:36 PM   Radiology CT ABDOMEN PELVIS W CONTRAST  Result Date: 08/24/2020 CLINICAL DATA:  Upper abdominal pain. EXAM: CT ABDOMEN AND PELVIS WITH CONTRAST TECHNIQUE: Multidetector CT imaging of the abdomen and pelvis was performed using the standard protocol following bolus administration of intravenous contrast. CONTRAST:  OMNIPAQUE IOHEXOL 300 MG/ML  SOLN COMPARISON:  None. FINDINGS: Lower chest: No acute abnormality. Hepatobiliary: No focal  liver abnormality is seen. No gallstones, gallbladder wall thickening, or biliary dilatation. Pancreas: Unremarkable. No pancreatic ductal dilatation or surrounding inflammatory changes. Spleen: Normal in size without focal abnormality. Adrenals/Urinary Tract: Adrenal glands are unremarkable. Kidneys are normal, without renal calculi, focal lesion, or hydronephrosis. Bladder is unremarkable. Stomach/Bowel: Stomach is within normal limits. Appendix appears normal. No evidence of bowel wall thickening, distention, or inflammatory changes. Vascular/Lymphatic: No significant vascular findings are present. No enlarged abdominal or pelvic lymph nodes. Reproductive: Prostate is unremarkable. Other: Small fat containing left inguinal hernia is noted. No ascites is noted. Musculoskeletal: No acute or significant osseous findings. IMPRESSION: Small fat containing left inguinal hernia. No other abnormality seen in the abdomen or pelvis. Electronically Signed   By: Lupita Raider M.D.   On: 08/24/2020 13:09    Procedures Procedures   Medications Ordered in ED Medications  ondansetron (ZOFRAN-ODT) disintegrating tablet 4 mg (4 mg Oral Given 08/23/20 1543)  morphine 4 MG/ML injection 4 mg (4 mg Intravenous Given 08/24/20 1145)  iohexol (OMNIPAQUE) 300 MG/ML solution 100 mL (100 mLs Intravenous Contrast Given 08/24/20 1300)    ED Course  I have reviewed the triage vital signs and the nursing notes.  Pertinent labs & imaging results that were available during my care of the patient were reviewed by me and considered in my medical decision making (see chart for details).  Clinical Course as of 08/24/20 1424  Sat Aug 24, 2020  1203 I spoke with Dr. Thornton Dales with [facility].  States in the office yesterday she wanted to give IV fluids and get lab work but patient rushed out of the office and did not want to.  He said that he was going to the ER.  She agrees with lab work and CT scan A/P.  Requesting a report  of labs and imaging today if patient is discharged.  She has him scheduled for an EGD upper on Monday.  If benign, recommend switching his PPI to a different PPI like pantoprazole.  [CG]  1318 CT ABDOMEN PELVIS W CONTRAST IMPRESSION: Small fat containing left inguinal hernia. No other abnormality seen in the abdomen or pelvis. [CG]  1334 EKG 12-Lead Sinus rhythm Consider right atrial enlargement Repol abnrm, global ischemia, diffuse leads Borderline ST elevation, anterolateral leads no significant change since Dec 2021 Confirmed by Pricilla Loveless [phone]) on 08/24/2020 12:13:36 PM [CG]  1334 Re-evaluated patient. Reports mild improvement in pain. Discussed CT labs results, plan to discharge with PPI. EGD on Monday.  [CG]    Clinical Course User Index [CG] Jerrell Mylar   MDM Rules/Calculators/A&P                           50 y.o. yo with chief complaint of abdominal pain, nausea, bloating, constipation for 6 weeks. History of recurrent H. Pylori. Sees Dr Gwendalyn Ege [facility]. Has upcoming upper EGI in 2 days. Worsening pain with eating, reports decreased oral intake and weight loss 15-20lbs. No abdominal surgeries. Last EGD shows "inflammation" per patient report, and colonoscopy normal less than 1 year ago.    Previous medical records available, triage and nursing notes reviewed to obtain more history and assist with MDM  Additional information obtained from Dr Gwendalyn Ege whom I called.  States last time patient was tested for H.pylori it was negative. Agrees with proceeding with CTAP.  Anticipates discharge with different PPI and follow up in 2 days for EGD.   Chief complain involves an extensive number of treatment options and is a complaint that carries with it a high risk of complications and morbidity and mortality.    Differential diagnosis: gastritis vs PUD vs uncontrolled GERD. Given duration of symptoms, SBO, pancreatitis, cholecystitis, appendicitis considered less likely.     ER  lab work and imaging ordered by triage RN and me, as above  I have personally visualized and interpreted ER diagnostic work up including labs and imaging.    Labs reveal - Normal WBC, lipase, LFTs.  UA unremarkable  Imaging reveals - CTAP reveals fat containing small left inguinal hernia, otherwise normal.  EKG ordered in triage shows known TWI in inferior/lateral leads with LVH pattern, unchanged from previous. Patient has no CP, SOB or other cardiac complaints today.     Medications ordered - morphine, zofran.   Ordered continuous cardiac and pulse ox monitoring.  Will plan for serial re-examinations. Close monitoring.   Re-evaluated the patient.   Discussed lab and CT findings, discussed with Dr Gwendalyn Ege.   Suspect GERD/gastritis/PUD. Patient has been non compliant with PPI because it hasn't been working.  Encouraged gastritis appropriate diet, recommended pantaprazole, carafate, zofran. 1-2 capfuls of miralax for constipation. Has scheduled for UEGD 08/26/20. Return precautions given.   Final Clinical Impression(s) / ED Diagnoses Final diagnoses:  Chronic constipation  Upper abdominal pain  Weight loss    Rx / DC Orders ED Discharge Orders         Ordered    ondansetron (ZOFRAN-ODT) 4 MG disintegrating tablet  Every 8 hours PRN        08/24/20 1335             Liberty Handy, PA-C 08/24/20 1425    Pricilla Loveless, MD 08/25/20 [phone]

## 2020-08-24 NOTE — Discharge Instructions (Signed)
You were seen in the ER for ongoing abdominal pain, constipation, nausea  CT scan showed small left inguinal hernia. Otherwise this was normal.  Labs were normal.   At this time I suspect your pain is from gastritis, uncontrolled acid reflux, less likely an ulcer.    Start taking pantoprazole daily.  Continue taking carafate pill 20 min before meals and at bedtime. You can take [phone] mg acetaminophen every 6 hours for pain.  I have prescribed dissolvable odansetron for nausea every 8 hours.  Stick to gentle foods, broth, etc.   Avoid alcohol, ibuprofen or aspirin products  Return for fever,vomiting, chest pain, shortness of breath, rectal pain or bleeding

## 2020-08-28 ENCOUNTER — Other Ambulatory Visit: Payer: 59

## 2020-09-09 ENCOUNTER — Other Ambulatory Visit: Payer: 59

## 2020-11-05 ENCOUNTER — Other Ambulatory Visit: Payer: Self-pay | Admitting: Cardiology

## 2021-05-08 ENCOUNTER — Telehealth: Payer: Self-pay | Admitting: *Deleted

## 2021-05-08 ENCOUNTER — Telehealth: Payer: Self-pay | Admitting: Cardiology

## 2021-05-08 NOTE — Telephone Encounter (Signed)
Ralph called requesting a sooner appointment with Dr. Herbie Baltimore due to having recent chest pain. I pulled up the availability for [facility] and advised the first available is 05/23/21 with Verdon Cummins and there was nothing sooner at this time. Samuel Gutierrez then asked the date of his appointment with Dr. Herbie Baltimore, which I gave and he stated he could live until then and then disconnected the call. Please advise.

## 2021-05-08 NOTE — Telephone Encounter (Signed)
Dr. Knox Royalty, PCP, called into speak with DOD today concerning the patients EKG. The EKG was faxed to the office for review. Per Dr. Antoine Poche, the EKG looks similar to the ones in the past. The patient does have a follow up on Monday 10/24.

## 2021-05-08 NOTE — Telephone Encounter (Signed)
Called patient back about message. Patient has been having chest pain that feels like pressure that comes and goes. Patient stated it has been going on for a month. Patient does not have a history of CAD, and has never had a stress test. Patient of Dr. Elissa Hefty. DOD had opening on Monday. Made patient an appointment to be evaluated. Encouraged patient to call 911 or go to ED if chest pain comes and is not relieved with rest or his symptoms get worse. Will forward to Dr. Herbie Baltimore so he is aware.

## 2021-05-10 NOTE — Telephone Encounter (Signed)
We will see him on Monday

## 2021-05-12 ENCOUNTER — Encounter: Payer: Self-pay | Admitting: Cardiology

## 2021-05-12 ENCOUNTER — Ambulatory Visit: Payer: 59 | Admitting: Cardiology

## 2021-05-12 ENCOUNTER — Other Ambulatory Visit: Payer: Self-pay

## 2021-05-12 VITALS — BP 124/60 | HR 94 | Ht 72.0 in | Wt 214.8 lb

## 2021-05-12 DIAGNOSIS — E1169 Type 2 diabetes mellitus with other specified complication: Secondary | ICD-10-CM

## 2021-05-12 DIAGNOSIS — E785 Hyperlipidemia, unspecified: Secondary | ICD-10-CM

## 2021-05-12 DIAGNOSIS — I10 Essential (primary) hypertension: Secondary | ICD-10-CM

## 2021-05-12 DIAGNOSIS — R002 Palpitations: Secondary | ICD-10-CM | POA: Diagnosis not present

## 2021-05-12 NOTE — Patient Instructions (Signed)
  Testing/Procedures:  Your physician has requested that you have an echocardiogram. Echocardiography is a painless test that uses sound waves to create images of your heart. It provides your doctor with information about the size and shape of your heart and how well your heart's chambers and valves are working. This procedure takes approximately one hour. There are no restrictions for this procedure. [address]   Follow-Up: At [facility], you and your health needs are our priority.  As part of our continuing mission to provide you with exceptional heart care, we have created designated Provider Care Teams.  These Care Teams include your primary Cardiologist (physician) and Advanced Practice Providers (APPs -  Physician Assistants and Nurse Practitioners) who all work together to provide you with the care you need, when you need it.  We recommend signing up for the patient portal called "MyChart".  Sign up information is provided on this After Visit Summary.  MyChart is used to connect with patients for Virtual Visits (Telemedicine).  Patients are able to view lab/test results, encounter notes, upcoming appointments, etc.  Non-urgent messages can be sent to your provider as well.   To learn more about what you can do with MyChart, go to [email or web address].    Your next appointment:   6-8 week(s)  The format for your next appointment:   In Person  Provider:   Bryan Lemma, MD

## 2021-05-12 NOTE — Progress Notes (Signed)
HPI: Follow-up hypertension, hyperlipidemia, abnormal ECG and chest pain.  Patient is followed by Dr. Herbie Baltimore.  Contacted the office with chest pain and added to our schedule today.  Echocardiogram April 2018 showed vigorous LV function, grade 1 diastolic dysfunction and mild left atrial enlargement.  Since last seen he denies dyspnea on exertion, orthopnea, PND, or exertional chest pain or syncope.  However he has developed intermittent palpitations.  They are described as his heart pounding typically lasting 10 to 15 minutes and resolve spontaneously.  When he has the palpitations he has associated chest pressure.  No nausea or diaphoresis and no dyspnea.  He was therefore added to my schedule today.  Current Outpatient Medications  Medication Sig Dispense Refill   amLODipine-valsartan (EXFORGE) 5-320 MG tablet TAKE 1 TABLET BY MOUTH EVERY DAY 90 tablet 2   atorvastatin (LIPITOR) 40 MG tablet Take 1 tablet (40 mg total) by mouth daily. 90 tablet 3   multivitamin (ONE-A-DAY MEN'S) TABS Take 1 tablet by mouth daily.     ondansetron (ZOFRAN-ODT) 4 MG disintegrating tablet Take 1 tablet (4 mg total) by mouth every 8 (eight) hours as needed for nausea or vomiting. 20 tablet 0   saxagliptin HCl (ONGLYZA) 5 MG TABS tablet Take by mouth daily.     Semaglutide 14 MG TABS Take 14 mg by mouth daily.     spironolactone (ALDACTONE) 25 MG tablet Take 1 tablet (25 mg total) by mouth daily. 90 tablet 3   sucralfate (CARAFATE) 1 g tablet Take 1 g by mouth 4 (four) times daily.     Vitamin D, Ergocalciferol, (DRISDOL) 1.25 MG (50000 UNIT) CAPS capsule Take 50,000 Units by mouth once a week.     No current facility-administered medications for this visit.     Past Medical History:  Diagnosis Date   Abnormal resting ECG findings    LVH with repolarization abnormalities.   Benign essential HTN    Has a history of medical nonadherence   LVH (left ventricular hypertrophy)    borderline    Past  Surgical History:  Procedure Laterality Date   TRANSTHORACIC ECHOCARDIOGRAM  10/2016   Normal LV size with vigorous motion.  EF 65-70%.  No dynamic obstruction, but hyperdynamic LV.  Increased flow velocities intracavity.  This is likely the cause of the murmur.  No evidence of valvular or subvalvular disease.  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 He is extremely fit and healthy doing routine weightlifting and cardiovascular exercises -- working out 5 days a week   He is currently single with 2 children -- his schedule is not amenable to relationship with the children's mother.   He does a knowledge of having  occasional glass of wine socially. He also will have occasional me one cigar a month with his friends, but nothing significant.   Social Determinants of Health   Financial Resource Strain: Not on file  Food Insecurity: Not on file  Transportation Needs: Not on file  Physical Activity: Not on file  Stress: Not on file  Social Connections: Not on file  Intimate Partner Violence: Not on file    Family History  Problem Relation Age of Onset   Hypertension Mother        2 Maternal uncles also HTN & DM   Diabetes Mellitus II Mother    Hypertension Maternal Grandmother    Diabetes Mellitus II Maternal Grandmother     ROS: no fevers or chills, productive cough,  hemoptysis, dysphasia, odynophagia, melena, hematochezia, dysuria, hematuria, rash, seizure activity, orthopnea, PND, pedal edema, claudication. Remaining systems are negative.  Physical Exam: Well-developed well-nourished in no acute distress.  Skin is warm and dry.  HEENT is normal.  Neck is supple.  Chest is clear to auscultation with normal expansion.  Cardiovascular exam is regular rate and rhythm.  Abdominal exam nontender or distended. No masses palpated. Extremities show no edema. neuro grossly intact  ECG-normal sinus rhythm at a rate of 94, inferolateral T wave inversion.  Unchanged compared to August 24, 2020.  Personally reviewed  A/P  1 chest pain/palpitations-patient is having intermittent palpitations that sound potentially to be SVT.  This is associated with chest pressure but he otherwise denies exertional chest pain.  We will arrange an echocardiogram to reassess LV function.  We also discussed options for evaluating his palpitations.  I discussed possible monitor versus smart watch.  He will consider purchasing a smart watch to record his rhythm at the time of his symptoms.  Further management based on results.  We will continue beta-blocker for now.  2 hypertension-patient's blood pressure is controlled.  Continue present medications.  3 hyperlipidemia-continue statin.  Lipids and liver monitored by primary care.  4 history of abnormal ECG-his electrocardiogram is unchanged today.  Olga Millers, MD

## 2021-05-16 ENCOUNTER — Ambulatory Visit (HOSPITAL_COMMUNITY): Payer: 59 | Attending: Cardiology

## 2021-05-16 ENCOUNTER — Other Ambulatory Visit: Payer: Self-pay

## 2021-05-16 DIAGNOSIS — R002 Palpitations: Secondary | ICD-10-CM | POA: Diagnosis not present

## 2021-05-16 LAB — ECHOCARDIOGRAM COMPLETE
Area-P 1/2: 2.66 cm2
S' Lateral: 2.7 cm

## 2021-05-16 MED ORDER — PERFLUTREN LIPID MICROSPHERE
1.0000 mL | INTRAVENOUS | Status: AC | PRN
  Administered 2021-05-16: 2 mL via INTRAVENOUS

## 2021-05-23 NOTE — Telephone Encounter (Signed)
Please advise 

## 2021-05-27 ENCOUNTER — Telehealth: Payer: Self-pay | Admitting: *Deleted

## 2021-05-27 DIAGNOSIS — R931 Abnormal findings on diagnostic imaging of heart and coronary circulation: Secondary | ICD-10-CM

## 2021-05-27 DIAGNOSIS — R0989 Other specified symptoms and signs involving the circulatory and respiratory systems: Secondary | ICD-10-CM

## 2021-05-27 NOTE — Telephone Encounter (Signed)
Spoke to patient. Information given  and results - Dr Herbie Baltimore also spoke to patient.  Patient aware  needs Cardiac MRi will be schedule   Needing lab CBC

## 2021-05-27 NOTE — Telephone Encounter (Addendum)
Echocardiogram results shows hyperdynamic left ventricle function with severe LVH and wall thickening.   This is may be some progression of disease from prior study.  Interestingly, despite having significantly thickened ventricle, there is only grade 1 diastolic dysfunction.   Recommendation was to check a cardiac MRI.   It would be nice if we could have this done prior to his follow-up with me.   Bryan Lemma, MD     ----- Message from Freddi Starr, RN sent at 05/19/2021  8:01 AM EDT -----  ----- Message ----- From: Lewayne Bunting, MD Sent: 05/16/2021   1:11 PM EDT To: Freddi Starr, RN  Will forward to Dr Herbie Baltimore (pt's cardiologist); needs cardiac MRI. Olga Millers

## 2021-07-01 ENCOUNTER — Telehealth (HOSPITAL_COMMUNITY): Payer: Self-pay | Admitting: Emergency Medicine

## 2021-07-01 NOTE — Telephone Encounter (Signed)
Unable to leave vm.

## 2021-07-02 ENCOUNTER — Ambulatory Visit (HOSPITAL_COMMUNITY)
Admission: RE | Admit: 2021-07-02 | Discharge: 2021-07-02 | Disposition: A | Discharge status: Home / Self Care | Payer: 59 | Source: Ambulatory Visit | Attending: Cardiology | Admitting: Cardiology

## 2021-07-02 NOTE — Progress Notes (Signed)
Pt was a no show for his cardiac MRI appt today. 

## 2021-08-09 ENCOUNTER — Other Ambulatory Visit: Payer: Self-pay | Admitting: Cardiology

## 2021-08-13 ENCOUNTER — Ambulatory Visit: Payer: 59 | Admitting: Cardiology

## 2021-09-09 ENCOUNTER — Other Ambulatory Visit: Payer: Self-pay | Admitting: Cardiology

## 2022-04-08 IMAGING — CT CT ABD-PELV W/ CM
2 of 5 series · 16 of 46 positions shown, 18 images · IV contrast (omnipaque)
Comparison: None.

CLINICAL DATA: Upper abdominal pain.

EXAM:
CT ABDOMEN AND PELVIS WITH CONTRAST
TECHNIQUE: Multidetector CT imaging of the abdomen and pelvis was performed
using the standard protocol following bolus administration of
intravenous contrast.
CONTRAST:  100mL OMNIPAQUE IOHEXOL 300 MG/ML  SOLN

[Series 3: abdomen 5.0 · axial · 0.78mm/px · z∈[+1100,+1476]mm · 13 of 89 slices shown, 15 images]
[im 7/89  soft-tissue]
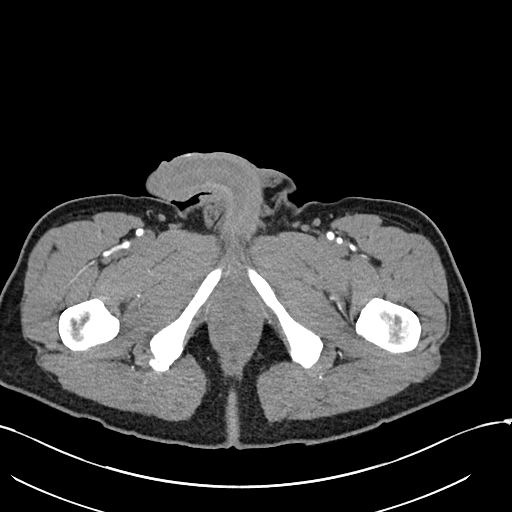
[im 7/89  bone]
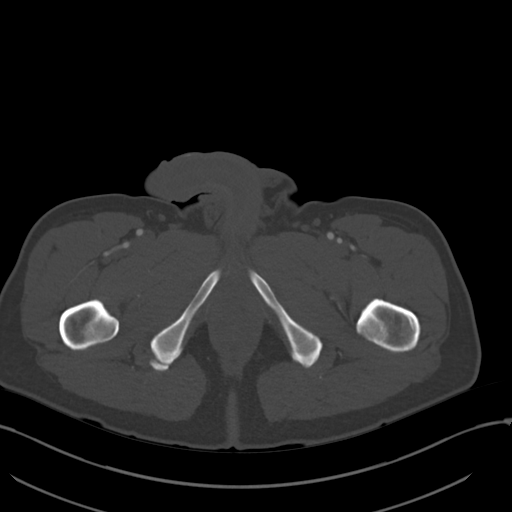
[im 13/89  soft-tissue]
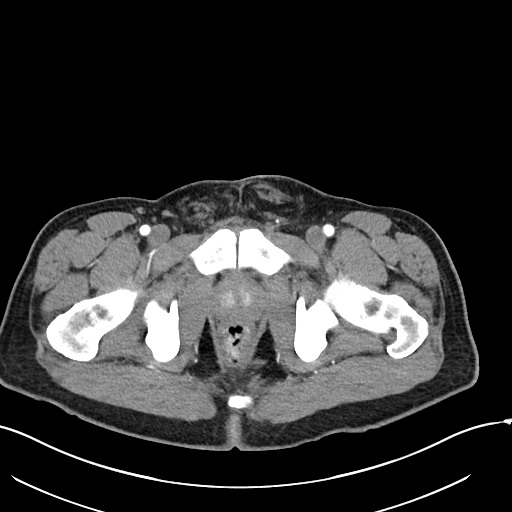
[im 19/89  soft-tissue]
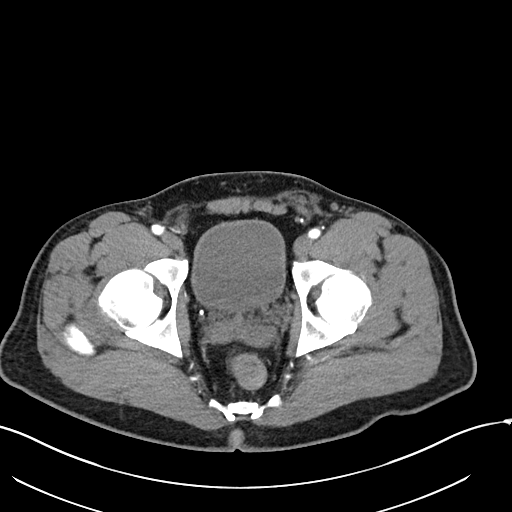
[im 26/89  soft-tissue]
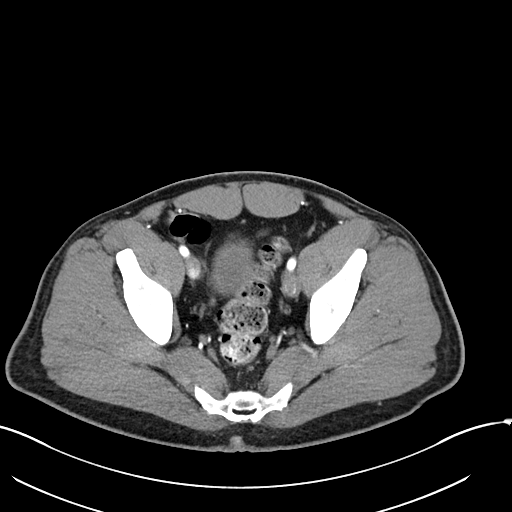
[im 32/89  soft-tissue]
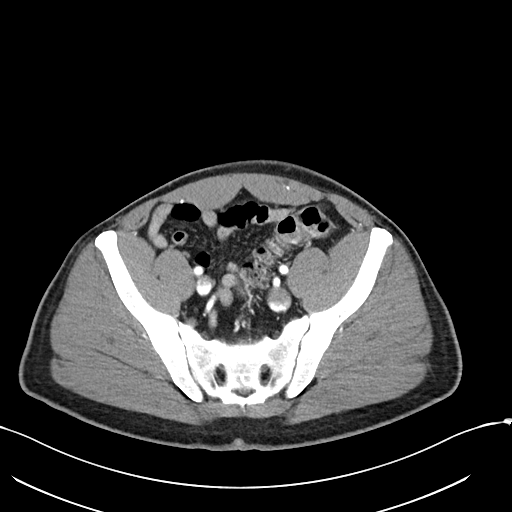
[im 38/89  soft-tissue]
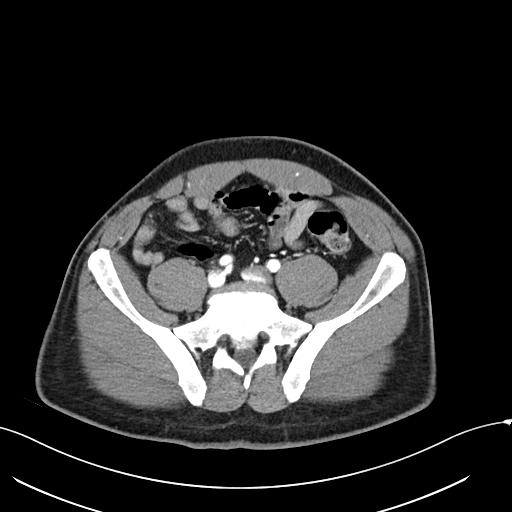
[im 45/89  soft-tissue]
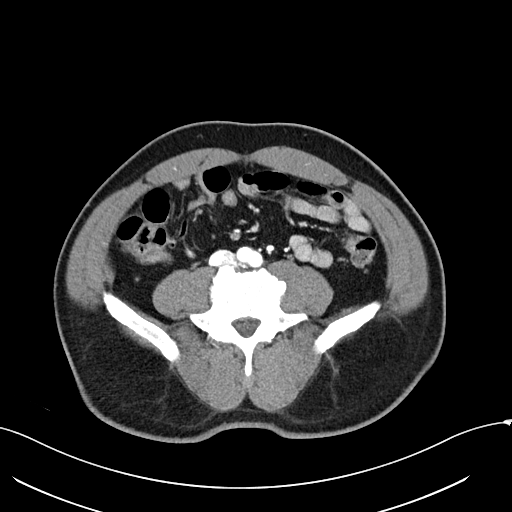
[im 51/89  soft-tissue]
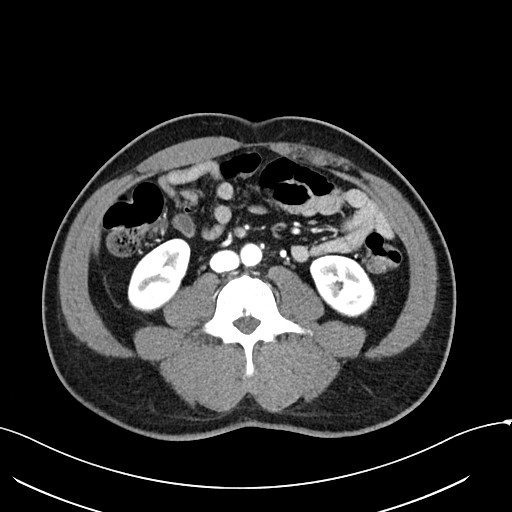
[im 57/89  soft-tissue]
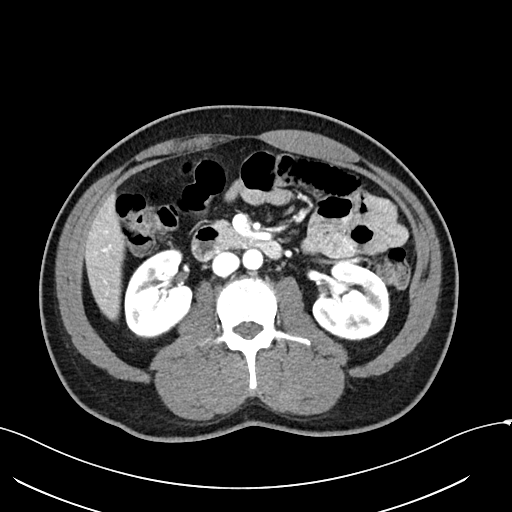
[im 57/89  bone]
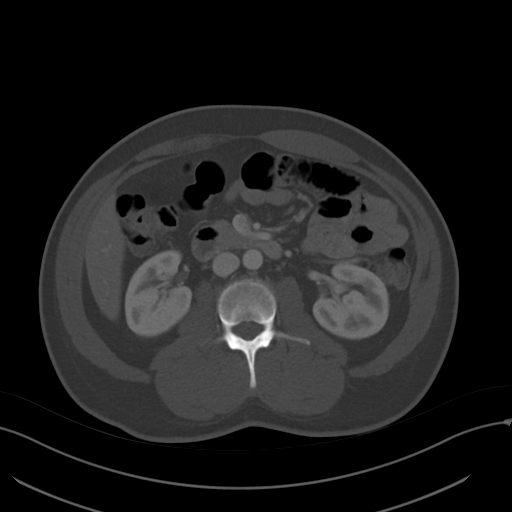
[im 63/89  soft-tissue]
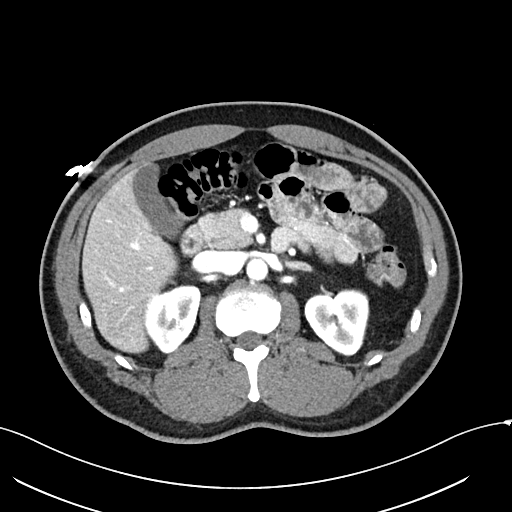
[im 70/89  soft-tissue]
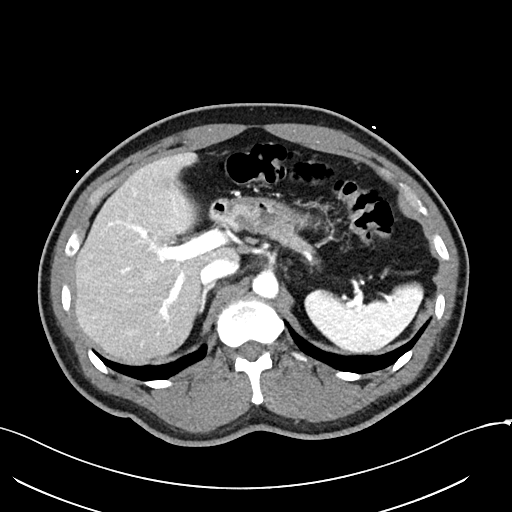
[im 76/89  soft-tissue]
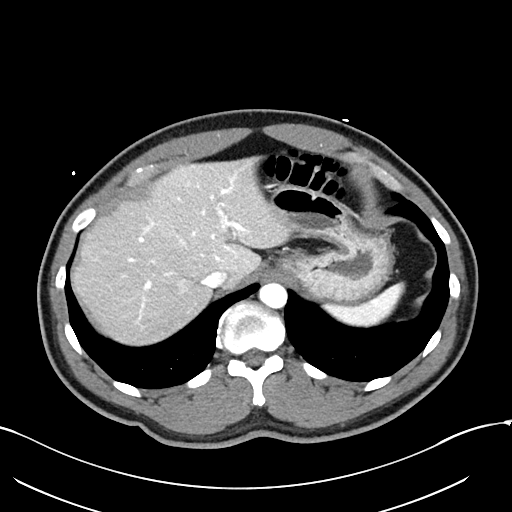
[im 82/89  soft-tissue]
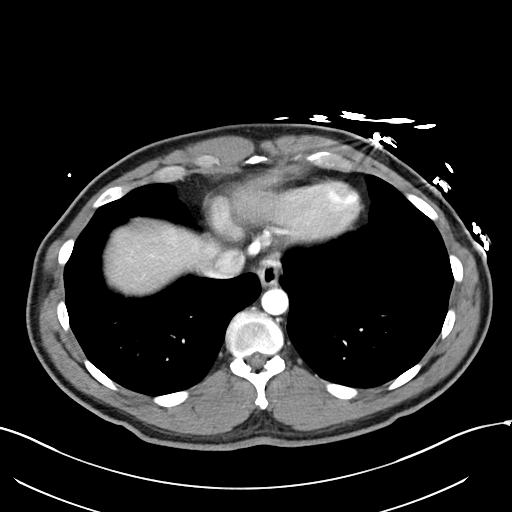

[Series 6: abdomen 3.0 mpr cor · coronal · 0.78mm/px · 3 of 93 slices shown]
[im 31/93  soft-tissue]
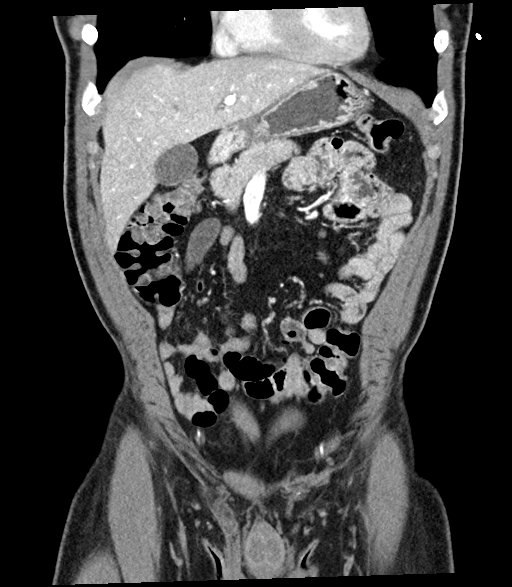
[im 41/93  soft-tissue]
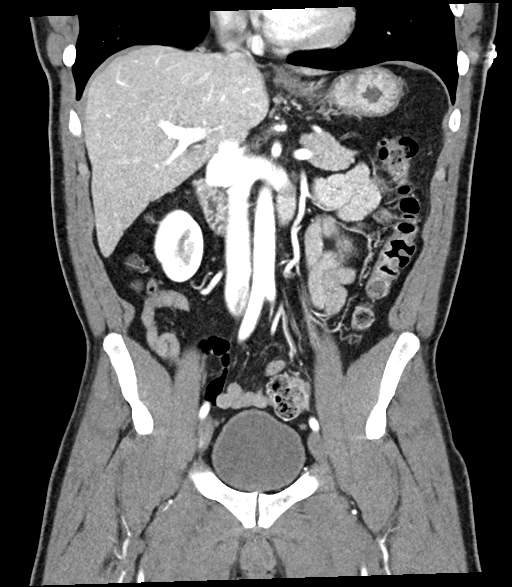
[im 52/93  soft-tissue]
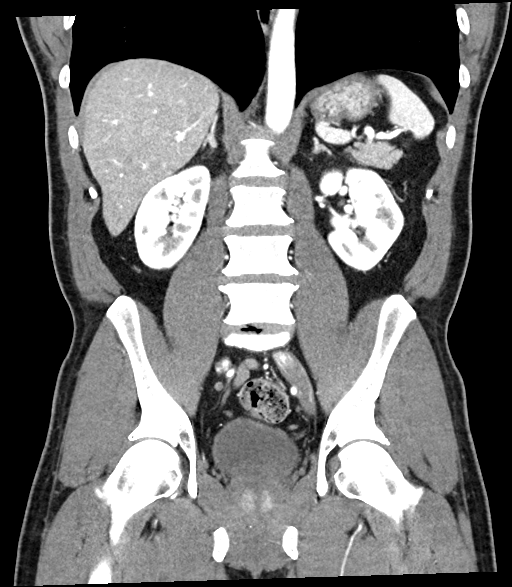

[16 of 46 positions shown; findings below may reference images not displayed]

FINDINGS: Lower chest: No acute abnormality.

Hepatobiliary: No focal liver abnormality is seen. No gallstones,
gallbladder wall thickening, or biliary dilatation.

Pancreas: Unremarkable. No pancreatic ductal dilatation or
surrounding inflammatory changes.

Spleen: Normal in size without focal abnormality.

Adrenals/Urinary Tract: Adrenal glands are unremarkable. Kidneys are
normal, without renal calculi, focal lesion, or hydronephrosis.
Bladder is unremarkable.

Stomach/Bowel: Stomach is within normal limits. Appendix appears
normal. No evidence of bowel wall thickening, distention, or
inflammatory changes.

Vascular/Lymphatic: No significant vascular findings are present. No
enlarged abdominal or pelvic lymph nodes.

Reproductive: Prostate is unremarkable.

Other: Small fat containing left inguinal hernia is noted. No
ascites is noted.

Musculoskeletal: No acute or significant osseous findings.
IMPRESSION: Small fat containing left inguinal hernia. No other abnormality seen
in the abdomen or pelvis.

## 2022-07-02 ENCOUNTER — Other Ambulatory Visit: Payer: Self-pay | Admitting: Cardiology

## 2022-08-21 ENCOUNTER — Other Ambulatory Visit: Payer: Self-pay | Admitting: Cardiology

## 2022-09-26 ENCOUNTER — Other Ambulatory Visit: Payer: Self-pay | Admitting: Cardiology

## 2022-10-05 ENCOUNTER — Telehealth: Payer: Self-pay | Admitting: Cardiology

## 2022-10-05 NOTE — Telephone Encounter (Signed)
*  STAT* If patient is at the pharmacy, call can be transferred to refill team.   1. Which medications need to be refilled? (please list name of each medication and dose if known)  amLODipine-valsartan (EXFORGE) 5-320 MG tablet   2. Which pharmacy/location (including street and city if local pharmacy) is medication to be sent to?  [id number] - [place], [place] - [address]    3. Do they need a 30 day or 90 day supply?  30 day  Patient has appt scheduled on 10/23/22

## 2022-10-06 MED ORDER — AMLODIPINE BESYLATE-VALSARTAN 5-320 MG PO TABS: 1.0000 | ORAL_TABLET | Freq: Every day | ORAL | 0 refills | Status: DC

## 2022-10-21 NOTE — Progress Notes (Signed)
Cardiology Clinic Note   Date: 10/23/2022 ID: Samuel Senter Sr., DOB 1971-02-25, MRN [id number]  Primary Cardiologist:  Bryan Lemma, MD  Patient Profile    Samuel Senter Sr. is a 52 y.o. male who presents to the clinic today for follow-up.   Past medical history significant for: Hypertension. Palpitations. Abnormal EKG. Echo 05/16/2021: EF >75%.  Severe asymmetric LVH of the apical and basal-septal segments.  Grade I DD.  Trivial MR.  Mild aortic valve sclerosis without stenosis.  Suggest cardiac MRI to rule out hypertrophic cardiomyopathy. T2DM. Tobacco abuse.   History of Present Illness    Samuel PEPITONE Sr. is a longtime patient of Dr. Herbie Baltimore followed for the above outlined history.  Patient was last seen in the office by Dr. Jens Som on 05/12/2021 as an add-on for chest pain and palpitations.  Discussed with patient options for evaluating palpitations including possible monitor versus smart watch.  He opted to possibly obtain a smart watch.  Echo was ordered which showed hyperdynamic LV function and severe LVH (as outlined above).  Cardiac MRI was recommended but does not appear to have been completed.  Patient has not been seen since.  Today, patient reports overall he is doing well. He continues to have brief episodes of palpitations.  Episodes present as fluttering in the center of his chest brought on by abrupt movements or exertion lasting for up to a minute and can occur several times a day, several times a week.  He does not typically notice symptoms when he is working out.  He denies associated shortness of breath, lightheadedness, presyncope, or syncope.  He also reports chest tightness that occurs with position changes but does not feel muscular people tightness is across the center of his chest and requires 5 to 10 minutes of rest before resolving.  This also happens a few times a day several times a week.  He attends the gym 5 times a week weightlifting and walking on  the treadmill.  He denies lower extremity edema, orthopnea, PND.  Patient smokes 2 to 3 cigars a week.   ROS: All other systems reviewed and are otherwise negative except as noted in History of Present Illness.  Studies Reviewed    ECG personally reviewed by me today: NSR ST and T wave abnormality, 88 bpm.  No significant changes from 05/12/2021.   Physical Exam    VS:  BP 132/78 (BP Location: Left Arm, Patient Position: Sitting, Cuff Size: Large)   Pulse 88   Ht 6' (1.829 m)   Wt 210 lb (95.3 kg)   SpO2 98%   BMI 28.48 kg/m  , BMI Body mass index is 28.48 kg/m.  GEN: Well nourished, well developed, in no acute distress. Neck: No JVD or carotid bruits. Cardiac:  RRR. No murmurs. No rubs or gallops.   Respiratory:  Respirations regular and unlabored. Clear to auscultation without rales, wheezing or rhonchi. GI: Soft, nontender, nondistended. Extremities: Radials/DP/PT 2+ and equal bilaterally. No clubbing or cyanosis. No edema.  Skin: Warm and dry, no rash. Neuro: Strength intact.  Assessment & Plan   Severe LVH.  Echo October 2022 showed EF >75%, severe asymmetric LVH with apical and basal-septal segments, Grade I DD.  MRI with recommended but does not appear to be completed.  Patient denies shortness of breath, DOE, dizziness, or fatigue.  He does complain of fluttering across center of his chest with abrupt movements that lasts for about a minute as well as mild chest tightness  changes that requires 5 to 10 minutes of rest to resolve.  Will order that today for further evaluation of his LVH. Palpitations.  Patient reports palpitations described as fluttering/skipped beat sensation that occurs with abrupt movements and lasts for approximately a minute.  This can happen several times a day, several times a week.  He denies associated shortness of breath lightheadedness, dizziness, presyncope, syncope with episodes.  Will get 14-day ZIO to further evaluate. Hypertension.  BP today  152/78 at check-in, 132/78 at recheck.  Patient has been out of his medications for 1.5 weeks.  Patient denies headaches or dizziness.  All medications reviewed.  Continue amlodipine-valsartan and spironolactone.  Disposition: 14-day ZIO.  Cardiac MRI.  CBC, BMP, TSH today.  Return in 6 to 8 weeks or sooner as needed.     Signed, Etta Grandchild. Meliah Appleman, DNP, NP-C

## 2022-10-23 ENCOUNTER — Ambulatory Visit (INDEPENDENT_AMBULATORY_CARE_PROVIDER_SITE_OTHER): Payer: 59

## 2022-10-23 ENCOUNTER — Other Ambulatory Visit: Payer: Self-pay

## 2022-10-23 ENCOUNTER — Encounter: Payer: Self-pay | Admitting: Student

## 2022-10-23 ENCOUNTER — Ambulatory Visit: Payer: 59 | Attending: Student | Admitting: Student

## 2022-10-23 VITALS — BP 132/78 | HR 88 | Ht 72.0 in | Wt 210.0 lb

## 2022-10-23 DIAGNOSIS — R002 Palpitations: Secondary | ICD-10-CM

## 2022-10-23 DIAGNOSIS — I517 Cardiomegaly: Secondary | ICD-10-CM

## 2022-10-23 DIAGNOSIS — I10 Essential (primary) hypertension: Secondary | ICD-10-CM | POA: Diagnosis not present

## 2022-10-23 MED ORDER — SPIRONOLACTONE 25 MG PO TABS: 25.0000 mg | ORAL_TABLET | Freq: Every day | ORAL | 11 refills | Status: DC

## 2022-10-23 MED ORDER — ATORVASTATIN CALCIUM 40 MG PO TABS: 40.0000 mg | ORAL_TABLET | Freq: Every day | ORAL | 11 refills | Status: DC

## 2022-10-23 MED ORDER — AMLODIPINE BESYLATE-VALSARTAN 5-320 MG PO TABS: 1.0000 | ORAL_TABLET | Freq: Every day | ORAL | 11 refills | Status: DC

## 2022-10-23 NOTE — Patient Instructions (Addendum)
Medication Instructions:  Your physician recommends that you continue on your current medications as directed. Please refer to the Current Medication list given to you today.  *If you need a refill on your cardiac medications before your next appointment, please call your pharmacy*   Lab Work: Your physician recommends that you have the following labs drawn today: TSH, CBC and BMET  If you have labs (blood work) drawn today and your tests are completely normal, you will receive your results only by: MyChart Message (if you have MyChart) OR A paper copy in the mail If you have any lab test that is abnormal or we need to change your treatment, we will call you to review the results.   Testing/Procedures: Your physician has requested that you have a cardiac MRI. Cardiac MRI uses a computer to create images of your heart as its beating, producing both still and moving pictures of your heart and major blood vessels. For further information please visit [email or web address]. Please follow the instruction sheet given to you today for more information. Please have the scheduled to be done AFTER you have completed your Zio.  ZIO XT- Long Term Monitor Instructions  Your physician has requested you wear a ZIO patch monitor for 14 days.  This is a single patch monitor. [employer] supplies one patch monitor per enrollment. Additional stickers are not available. Please do not apply patch if you will be having a Nuclear Stress Test,  Echocardiogram, Cardiac CT, MRI, or Chest Xray during the period you would be wearing the  monitor. The patch cannot be worn during these tests. You cannot remove and re-apply the  ZIO XT patch monitor.  Your ZIO patch monitor will be mailed 3 day USPS to your address on file. It may take 3-5 days  to receive your monitor after you have been enrolled.  Once you have received your monitor, please review the enclosed instructions. Your monitor  has already been registered assigning  a specific monitor serial # to you.  Billing and Patient Assistance Program Information  We have supplied [employer] with any of your insurance information on file for billing purposes. [employer] offers a sliding scale Patient Assistance Program for patients that do not have  insurance, or whose insurance does not completely cover the cost of the ZIO monitor.  You must apply for the Patient Assistance Program to qualify for this discounted rate.  To apply, please call [employer] at [phone], select option 4, select option 2, ask to apply for  Patient Assistance Program. [employer] will ask your household income, and how many people  are in your household. They will quote your out-of-pocket cost based on that information.  [employer] will also be able to set up a 7137-month, interest-free payment plan if needed.  Applying the monitor   Shave hair from upper left chest.  Hold abrader disc by orange tab. Rub abrader in 40 strokes over the upper left chest as  indicated in your monitor instructions.  Clean area with 4 enclosed alcohol pads. Let dry.  Apply patch as indicated in monitor instructions. Patch will be placed under collarbone on left  side of chest with arrow pointing upward.  Rub patch adhesive wings for 2 minutes. Remove white label marked "1". Remove the white  label marked "2". Rub patch adhesive wings for 2 additional minutes.  While looking in a mirror, press and release button in center of patch. A small green light will  flash 3-4 times. This will be your only indicator  that the monitor has been turned on.  Do not shower for the first 24 hours. You may shower after the first 24 hours.  Press the button if you feel a symptom. You will hear a small click. Record Date, Time and  Symptom in the Patient Logbook.  When you are ready to remove the patch, follow instructions on the last 2 pages of Patient  Logbook. Stick patch monitor onto the last page of Patient Logbook.  Place Patient  Logbook in the blue and white box. Use locking tab on box and tape box closed  securely. The blue and white box has prepaid postage on it. Please place it in the mailbox as  soon as possible. Your physician should have your test results approximately 7 days after the  monitor has been mailed back to [employer].  Call [facility] Customer Care at [phone] if you have questions regarding  your ZIO XT patch monitor. Call them immediately if you see an orange light blinking on your  monitor.  If your monitor falls off in less than 4 days, contact our Monitor department at [phone].  If your monitor becomes loose or falls off after 4 days call [employer] at [phone] for  suggestions on securing your monitor     Follow-Up: At [facility], you and your health needs are our priority.  As part of our continuing mission to provide you with exceptional heart care, we have created designated Provider Care Teams.  These Care Teams include your primary Cardiologist (physician) and Advanced Practice Providers (APPs -  Physician Assistants and Nurse Practitioners) who all work together to provide you with the care you need, when you need it.  We recommend signing up for the patient portal called "MyChart".  Sign up information is provided on this After Visit Summary.  MyChart is used to connect with patients for Virtual Visits (Telemedicine).  Patients are able to view lab/test results, encounter notes, upcoming appointments, etc.  Non-urgent messages can be sent to your provider as well.   To learn more about what you can do with MyChart, go to [email or web address].    Your next appointment:   6-8 week(s)  Provider:   Bryan Lemmaavid Harding, MD or Carlos Leveringeborah Wittenborn, NP     [facility] [address] [place], [place] [address] [phone] Please take advantage of the free valet parking available at the MAIN entrance (A entrance).  Proceed to the [facility]  Radiology Department (First Floor) for check-in.   Magnetic resonance imaging (MRI) is a painless test that produces images of the inside of the body without using Xrays.  During an MRI, strong magnets and radio waves work together in a Data processing managermagnetic field to form detailed images.   MRI images may provide more details about a medical condition than X-rays, CT scans, and ultrasounds can provide.  You may be given earphones to listen for instructions.  You may eat a light breakfast and take medications as ordered with the exception of furosemide, hydrochlorothiazide, or spironolactone(fluid pill, other). Please avoid stimulants for 12 hr prior to test. (Ie. Caffeine, nicotine, chocolate, or antihistamine medications)  If a contrast material will be used, an IV will be inserted into one of your veins. Contrast material will be injected into your IV. It will leave your body through your urine within a day. You may be told to drink plenty of fluids to help flush the contrast material out of your system.  You will be asked to remove  all metal, including: Watch, jewelry, and other metal objects including hearing aids, hair pieces and dentures. Also wearable glucose monitoring systems (ie. Freestyle Libre and Omnipods) (Braces and fillings normally are not a problem.)   TEST WILL TAKE APPROXIMATELY 1 HOUR  PLEASE NOTIFY SCHEDULING AT LEAST 24 HOURS IN ADVANCE IF YOU ARE UNABLE TO KEEP YOUR APPOINTMENT. [phone]  Please call Rockwell Alexandria, cardiac imaging nurse navigator with any questions/concerns. Rockwell Alexandria RN Navigator Cardiac Imaging Larey Brick RN Navigator Cardiac Imaging [facility] [phone] Office

## 2022-10-23 NOTE — Progress Notes (Unsigned)
Enrolled patient for a 14 day Zio XT monitor to be mailed to patients home  Dr Harding to read 

## 2022-10-24 LAB — CBC
Hematocrit: 47.2 % (ref 37.5–51.0)
Hemoglobin: 15.6 g/dL (ref 13.0–17.7)
MCH: 29.7 pg (ref 26.6–33.0)
MCHC: 33.1 g/dL (ref 31.5–35.7)
MCV: 90 fL (ref 79–97)
Platelets: 218 10*3/uL (ref 150–450)
RBC: 5.26 x10E6/uL (ref 4.14–5.80)
RDW: 12.1 % (ref 11.6–15.4)
WBC: 6.3 10*3/uL (ref 3.4–10.8)

## 2022-10-24 LAB — BASIC METABOLIC PANEL
BUN/Creatinine Ratio: 9 (ref 9–20)
BUN: 11 mg/dL (ref 6–24)
CO2: 20 mmol/L (ref 20–29)
Calcium: 8.4 mg/dL — ABNORMAL LOW (ref 8.7–10.2)
Chloride: 107 mmol/L — ABNORMAL HIGH (ref 96–106)
Creatinine, Ser: 1.28 mg/dL — ABNORMAL HIGH (ref 0.76–1.27)
Glucose: 223 mg/dL — ABNORMAL HIGH (ref 70–99)
Potassium: 3.9 mmol/L (ref 3.5–5.2)
Sodium: 142 mmol/L (ref 134–144)
eGFR: 68 mL/min/{1.73_m2} (ref >59)

## 2022-10-24 LAB — TSH: TSH: 1.17 u[IU]/mL (ref 0.450–4.500)

## 2022-10-27 DIAGNOSIS — R002 Palpitations: Secondary | ICD-10-CM | POA: Diagnosis not present

## 2022-12-01 ENCOUNTER — Telehealth (HOSPITAL_COMMUNITY): Payer: Self-pay | Admitting: *Deleted

## 2022-12-01 NOTE — Telephone Encounter (Signed)
Attempted to call patient regarding upcoming cardiac MRI appointment. Unable to leave a message.  Rawad Bochicchio RN Navigator Cardiac Imaging [facility] [phone] Office [phone] Cell  

## 2022-12-02 ENCOUNTER — Ambulatory Visit (HOSPITAL_COMMUNITY): Admission: RE | Admit: 2022-12-02 | Payer: 59 | Source: Ambulatory Visit

## 2022-12-08 NOTE — Progress Notes (Signed)
Cardiology Clinic Note   Date: 12/11/2022 ID: Samuel Senter Sr., DOB 1970-08-01, MRN [id number]  Primary Cardiologist:  Samuel Lemma, MD  Patient Profile    Samuel Senter Sr. is a 52 y.o. male who presents to the clinic today for follow up from testing.   Past medical history significant for: Hypertension. Palpitations. 14 day Zio 11/19/2022: Predominant underlying rhythm is sinus HR 57 to 169 bpm, average 88 bpm.  Occasional PVCs (1.3%) with couplets and triplets.  Short spells of bigeminy and trigeminy.  Symptoms noted with sinus rhythm and PVCs.  Rare PACs.  7 runs of NSVT, fastest 4 beats (1.4 seconds) rate 245 bpm, longest 7 beats (4.2 seconds) average rate 102 bpm.  3 consecutive runs within 30 seconds.  6 runs of A. tach fastest 4 beats (1.4 seconds) max rate 218 bpm, longest 6 beats (2.6 seconds) average 152 bpm.  No sustained arrhythmias.  No prolonged pauses or significant bradycardia.  No AV block. Severe LVH. Echo 05/16/2021: EF >75%.  Severe asymmetric LVH of the apical and basal-septal segments.  Grade I DD.  Trivial MR.  Mild aortic valve sclerosis without stenosis.  Suggest cardiac MRI to rule out hypertrophic cardiomyopathy. T2DM. Tobacco abuse.   History of Present Illness    Samuel BAKOWSKI Sr.  is a longtime patient of Dr. Herbie Gutierrez followed for the above outlined history.  Patient was last seen in the office by Dr. Jens Gutierrez on 05/12/2021 as an add-on for chest pain and palpitations.  Discussed with patient options for evaluating palpitations including possible monitor versus smart watch.  He opted to possibly obtain a smart watch.  Echo was ordered which showed hyperdynamic LV function and severe LVH (as outlined above).  Cardiac MRI was recommended but does not appear to have been completed.   Patient was last seen in the office by me on 10/23/2022 with complaints of episodic palpitations related to abrupt movements or exertion lasting up to a minute occurring several  times a day, several times a week. Not typically noticed when working out. MRI was reordered to evaluate severe LVH found on echo in 2022.  He wore a 14-day ZIO which did not show any sustained arrhythmias (details above).  Today, patient is doing well. Discussed results of heart monitor. He believes the times he had increased heart rate was when he was at work. 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 He also notes he felt skipped beats when he smoked cigars. He has cut down on his smoking and has noticed a difference. He has concerns about starting a beta blocker secondary to side effects. Patient denies shortness of breath or dyspnea on exertion. No chest pain, pressure, or tightness. Denies lower extremity edema, orthopnea, or PND.   ROS: All other systems reviewed and are otherwise negative except as noted in History of Present Illness.  Studies Reviewed    ECG is not ordered today.          Physical Exam    VS:  BP 122/78   Pulse 85   Ht 6' (1.829 m)   Wt 216 lb (98 kg)   SpO2 98%   BMI 29.29 kg/m  , BMI Body mass index is 29.29 kg/m.  GEN: Well nourished, well developed, in no acute distress. Neck: No JVD or carotid bruits. Cardiac:  RRR. No murmurs. No rubs or gallops.   Respiratory:  Respirations regular and unlabored. Clear to auscultation without rales, wheezing or rhonchi. GI:  Soft, nontender, nondistended. Extremities: Radials/DP/PT 2+ and equal bilaterally. No clubbing or cyanosis. No edema.  Skin: Warm and dry, no rash. Neuro: Strength intact.  Assessment & Plan    Palpitations.  14-day ZIO did not show any sustained arrhythmias but did show runs of NSVT, A. tach occasional PVCs.  Patient reported symptoms with sinus rhythm with PVCs.  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 The times the monitor picked up on fast heart rate was when he was at work. He also reports feeling skipped beats when he smoked cigars. He has cut  down and feels this has improved. He would rather not go on a beta blocker as he thinks he has had this before and did not tolerate it and he has concerns about the side effects. Will start Cardizem CD 120 mg daily. Will have him stop Exforge and prescribe Valsartan 320 mg daily.  Hypertension. BP today 122/78. Patient denies headaches, dizziness or vision changes. Start Cardizem CD 120 mg daily. Continue spironolactone. Tobacco abuse. Patient smokes cigars. He noticed increased palpitations when smoking. He has cut down and feels it has improved. He is encouraged to continue to work toward complete cessation.   Disposition: Start Cardizem 120 mg daily. Return in 3 months or sooner as needed.   ADDENDUM: Realized after patient left the office that he was already taking Exforge (amlodipine-Valsartan). Will stop exforge and start Valsartan 320 mg daily. Attempted to contact patient via telephone several times. Unable to leave message.  Patient was sent message via My Chart with instructions for medication changes. Patient's local pharmacy was also contacted. He has not picked medication up yet. Explained situation with pharmacy who insured Exforge would not be refilled and instructions would be given to patient.          Signed, Etta Grandchild. Davon Folta, DNP, NP-C

## 2022-12-11 ENCOUNTER — Encounter: Payer: Self-pay | Admitting: Student

## 2022-12-11 ENCOUNTER — Ambulatory Visit: Payer: 59 | Attending: Student | Admitting: Student

## 2022-12-11 VITALS — BP 122/78 | HR 85 | Ht 72.0 in | Wt 216.0 lb

## 2022-12-11 DIAGNOSIS — R002 Palpitations: Secondary | ICD-10-CM | POA: Diagnosis not present

## 2022-12-11 DIAGNOSIS — I10 Essential (primary) hypertension: Secondary | ICD-10-CM | POA: Diagnosis not present

## 2022-12-11 MED ORDER — VALSARTAN 320 MG PO TABS: 320.0000 mg | ORAL_TABLET | Freq: Every day | ORAL | 3 refills | Status: DC

## 2022-12-11 MED ORDER — DILTIAZEM HCL ER COATED BEADS 120 MG PO CP24: 120.0000 mg | ORAL_CAPSULE | Freq: Every day | ORAL | 3 refills | Status: DC

## 2022-12-11 NOTE — Patient Instructions (Signed)
Medication Instructions:  Your physician has recommended you make the following change in your medication:  START: Cardizem CD 120mg  daily.  *If you need a refill on your cardiac medications before your next appointment, please call your pharmacy*   Lab Work: NONE If you have labs (blood work) drawn today and your tests are completely normal, you will receive your results only by: MyChart Message (if you have MyChart) OR A paper copy in the mail If you have any lab test that is abnormal or we need to change your treatment, we will call you to review the results.   Testing/Procedures: NONE   Follow-Up: At [facility], you and your health needs are our priority.  As part of our continuing mission to provide you with exceptional heart care, we have created designated Provider Care Teams.  These Care Teams include your primary Cardiologist (physician) and Advanced Practice Providers (APPs -  Physician Assistants and Nurse Practitioners) who all work together to provide you with the care you need, when you need it.  We recommend signing up for the patient portal called "MyChart".  Sign up information is provided on this After Visit Summary.  MyChart is used to connect with patients for Virtual Visits (Telemedicine).  Patients are able to view lab/test results, encounter notes, upcoming appointments, etc.  Non-urgent messages can be sent to your provider as well.   To learn more about what you can do with MyChart, go to [email or web address].    Your next appointment:   3 month(s)  Provider:   Bryan Lemma, MD

## 2023-03-24 LAB — LAB REPORT - SCANNED
A1c: 7
Creatinine, POC: 105.1 mg/dL
EGFR: 74

## 2023-03-29 ENCOUNTER — Encounter: Payer: Self-pay | Admitting: Cardiology

## 2023-03-29 NOTE — Telephone Encounter (Signed)
I do think that it makes sense for him to be seen if not in person at least virtual check systems clarify his symptoms medical medicines he is taking.

## 2023-04-01 NOTE — Progress Notes (Signed)
Cardiology Clinic Note   Date: 04/02/2023 ID: Samuel Senter Sr., DOB 25-Apr-1971, MRN [id number]  Primary Cardiologist:  Bryan Lemma, MD  Patient Profile    Samuel Senter Sr. is a 52 y.o. male who presents to the clinic today for chest discomfort.     Past medical history significant for: Hypertension. Palpitations. 14 day Zio 11/19/2022: Predominant underlying rhythm is sinus HR 57 to 169 bpm, average 88 bpm.  Occasional PVCs (1.3%) with couplets and triplets.  Short spells of bigeminy and trigeminy.  Symptoms noted with sinus rhythm and PVCs.  Rare PACs.  7 runs of NSVT, fastest 4 beats (1.4 seconds) rate 245 bpm, longest 7 beats (4.2 seconds) average rate 102 bpm.  3 consecutive runs within 30 seconds.  6 runs of A. tach fastest 4 beats (1.4 seconds) max rate 218 bpm, longest 6 beats (2.6 seconds) average 152 bpm.  No sustained arrhythmias.  No prolonged pauses or significant bradycardia.  No AV block. Severe LVH. Echo 05/16/2021: EF >75%.  Severe asymmetric LVH of the apical and basal-septal segments.  Grade I DD.  Trivial MR.  Mild aortic valve sclerosis without stenosis.  Suggest cardiac MRI to rule out hypertrophic cardiomyopathy. T2DM. Tobacco abuse.     History of Present Illness    Samuel MOGA Sr. is a longtime patient of Dr. Herbie Baltimore followed for the above outlined history.   Patient was last seen in the office by me on 12/11/2022 for follow-up after cardiac testing.  At his office visit in April, also with me, he complained of episodic palpitations related to abrupt movements or exertion lasting up to a minute occurring several times a day several times a week.  Not typically noticed when working out.  MRI was reordered to evaluate severe LVH found on echo in 2022.  14-day ZIO showed occasional PVCs with couplets and triplets (1.3%), short spells of bigeminy and trigeminy, rare PACs, 7 runs of NSVT, 6 runs of A. tach with symptoms noted during sinus rhythm and PVCs.  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  He also noted skipped beats when smoking and was cutting back with decrease in symptoms.  Patient with concerns for starting beta-blocker secondary to side effects.  His Exforge was stopped and he was started on Cardizem and valsartan.  Patient contacted the office via MyChart on 03/29/2023 with complaints of chest discomfort and increased palpitations since medication changes.  It was recommended patient be evaluated in the office and he was scheduled.  Today, patient reports continued palpitations that are the same as previously described but possibly happening a little more frequently. His main concern today chest discomfort in the center of his chest occurring with and without exertion and lasting up to 30 minutes. Discomfort is described as a "weight" in the center of his chest. Pain will resolve with rest or drinking ginger ale and belching. Tums does nothing for the pain. He has been experiencing these symptoms for about 2 months. Discomfort has occurred while working at and caused him to stop and leave the gym "it makes me want to stop doing what I am doing." He denies associated shortness of breath. Discomfort is not associated with palpitations. He recently found out he has a strong family history of stroke in his grandmother, mother, uncle and cousin. He is not sure about family history of CAD. He is very  active working out 4 days a week.     ROS: All other systems reviewed and are otherwise negative except as noted in History of Present Illness.  Studies Reviewed    EKG Interpretation Date/Time:  Friday April 02 2023 09:24:51 EDT Ventricular Rate:  96 PR Interval:  136 QRS Duration:  92 QT Interval:  360 QTC Calculation: 454 R Axis:   34  Text Interpretation: Normal sinus rhythm ST & T wave abnormality, consider inferior ischemia ST & T wave abnormality,  consider anterolateral ischemia When compared with ECG of 10/23/2022 No significant changes Confirmed by Carlos Levering [phone]) on 04/02/2023 9:29:16 AM           Physical Exam    VS:  BP 124/78   Pulse 96   Ht 6' (1.829 m)   Wt 226 lb (102.5 kg)   SpO2 95%   BMI 30.65 kg/m  , BMI Body mass index is 30.65 kg/m.  GEN: Well nourished, well developed, in no acute distress. Neck: No JVD or carotid bruits. Cardiac:  RRR. No murmurs. No rubs or gallops.   Respiratory:  Respirations regular and unlabored. Clear to auscultation without rales, wheezing or rhonchi. GI: Soft, nontender, nondistended. Extremities: Radials/DP/PT 2+ and equal bilaterally. No clubbing or cyanosis. No edema.  Skin: Warm and dry, no rash. Neuro: Strength intact.  Assessment & Plan    Chest discomfort.  Patient describes a 2 month history of chest discomfort in the center of his chest described as a weight and lasting 30 minutes. Pain occurs with and without exertion and has caused him to leave the gym secondary to discomfort. Pain is relieved with rest or drinking ginger ale and belching. Tums has no effect on pain. No shortness of breath. Does not necessarily occur with palpitations. Given typical and atypical features of chest pain will get a coronary CTA for further evaluation.  Palpitations.  14-day ZIO May 2024 showed occasional PVCs, rare PACs, 7 runs of NSVT, 6 runs of A. tach, no sustained arrhythmias.  Patient reports continued palpitations since starting diltiazem. These feel the same as previously described but may be occurring more frequently. He would prefer to address his chest discomfort before adjusting any medications. Would consider increasing diltiazem (and possibly decreasing valsartan).  Severe LVH.  Echo October 2022 showed hyperdynamic LV function, severe asymmetric LVH of the apical and basal-septal segment, Grade I DD.  MRI has been ordered but not completed.  Patient denies lightheadedness,  dizziness, presyncope, syncope, shortness of breath. He reports he has not been contacted for scheduling. Discussed that he is difficult to get in touch with and does not have his voicemail set up. Will look into getting patient scheduled.  Hypertension. BP today 124/78.  Patient denies headaches, dizziness or vision changes. Continue diltiazem, valsartan, and spironolactone.  Hyperlipidemia. Patient's cholesterol checked by [facility]. Will get latest labs and records.   Disposition: Requested labs and records from last week from [facility]. Coronary CTA. Return in 2 months or sooner as needed.          Signed, Etta Grandchild. Kasy Iannacone, DNP, NP-C

## 2023-04-02 ENCOUNTER — Ambulatory Visit: Payer: 59 | Attending: Student | Admitting: Student

## 2023-04-02 ENCOUNTER — Encounter: Payer: Self-pay | Admitting: Student

## 2023-04-02 VITALS — BP 124/78 | HR 96 | Ht 72.0 in | Wt 226.0 lb

## 2023-04-02 DIAGNOSIS — R079 Chest pain, unspecified: Secondary | ICD-10-CM

## 2023-04-02 DIAGNOSIS — I517 Cardiomegaly: Secondary | ICD-10-CM

## 2023-04-02 DIAGNOSIS — R002 Palpitations: Secondary | ICD-10-CM | POA: Diagnosis not present

## 2023-04-02 DIAGNOSIS — I10 Essential (primary) hypertension: Secondary | ICD-10-CM

## 2023-04-02 DIAGNOSIS — E7800 Pure hypercholesterolemia, unspecified: Secondary | ICD-10-CM

## 2023-04-02 MED ORDER — METOPROLOL TARTRATE 100 MG PO TABS: ORAL_TABLET | ORAL | 0 refills | Status: DC

## 2023-04-02 MED ORDER — VALSARTAN 320 MG PO TABS: 320.0000 mg | ORAL_TABLET | Freq: Every day | ORAL | 11 refills | Status: DC

## 2023-04-02 MED ORDER — DILTIAZEM HCL ER COATED BEADS 120 MG PO CP24: 120.0000 mg | ORAL_CAPSULE | Freq: Every day | ORAL | 11 refills | Status: DC

## 2023-04-02 MED ORDER — SPIRONOLACTONE 25 MG PO TABS: 25.0000 mg | ORAL_TABLET | Freq: Every day | ORAL | 11 refills | Status: AC

## 2023-04-02 MED ORDER — ATORVASTATIN CALCIUM 40 MG PO TABS: 40.0000 mg | ORAL_TABLET | Freq: Every day | ORAL | 11 refills | Status: DC

## 2023-04-02 NOTE — Patient Instructions (Signed)
Medication Instructions:  Take the metoprolol 100mg  90 minutes to 2 hours before procedure.  *If you need a refill on your cardiac medications before your next appointment, please call your pharmacy*   Lab Work: We will request your most recent labs from your PCP. If you have labs (blood work) drawn today and your tests are completely normal, you will receive your results only by: MyChart Message (if you have MyChart) OR A paper copy in the mail If you have any lab test that is abnormal or we need to change your treatment, we will call you to review the results.   Testing/Procedures:   Your cardiac CT will be scheduled at one of the below locations:   [facility] [address] [place], [place] [address] [phone]   If scheduled at [facility], please arrive at the [facility] and Children's Entrance (Entrance C2) of [facility] 30 minutes prior to test start time. You can use the FREE valet parking offered at entrance C (encouraged to control the heart rate for the test)  Proceed to the [facility] Radiology Department (first floor) to check-in and test prep.  All radiology patients and guests should use entrance C2 at [facility], accessed from [facility], even though the hospital's physical address listed is [address]    If scheduled at [facility] or [facility], please arrive 15 mins early for check-in and test prep.  There is spacious parking and easy access to the radiology department from the [facility] Heart and Vascular entrance. Please enter here and check-in with the desk attendant.   Please follow these instructions carefully (unless otherwise directed):  An IV will be required for this test and Nitroglycerin will be given.  Hold all erectile dysfunction medications at least 3 days (72 hrs) prior to test. (Ie viagra, cialis, sildenafil, tadalafil, etc)   On  the Night Before the Test: Be sure to Drink plenty of water. Do not consume any caffeinated/decaffeinated beverages or chocolate 12 hours prior to your test. Do not take any antihistamines 12 hours prior to your test. If the patient has contrast allergy: Patient will need a prescription for Prednisone and very clear instructions (as follows): Prednisone 50 mg - take 13 hours prior to test Take another Prednisone 50 mg 7 hours prior to test Take another Prednisone 50 mg 1 hour prior to test Take Benadryl 50 mg 1 hour prior to test Patient must complete all four doses of above prophylactic medications. Patient will need a ride after test due to Benadryl.  On the Day of the Test: Drink plenty of water until 1 hour prior to the test. Do not eat any food 1 hour prior to test. You may take your regular medications prior to the test.  Take metoprolol (Lopressor) two hours prior to test. If you take Furosemide/Hydrochlorothiazide/Spironolactone, please HOLD on the morning of the test.  After the Test: Drink plenty of water. After receiving IV contrast, you may experience a mild flushed feeling. This is normal. On occasion, you may experience a mild rash up to 24 hours after the test. This is not dangerous. If this occurs, you can take Benadryl 25 mg and increase your fluid intake. If you experience trouble breathing, this can be serious. If it is severe call 911 IMMEDIATELY. If it is mild, please call our office. If you take any of these medications: Glipizide/Metformin, Avandament, Glucavance, please do not take 48  hours after completing test unless otherwise instructed.  We will call to schedule your test 2-4 weeks out understanding that some insurance companies will need an authorization prior to the service being performed.   For more information and frequently asked questions, please visit our website : [email or web address]  For non-scheduling related questions, please  contact the cardiac imaging nurse navigator should you have any questions/concerns: Cardiac Imaging Nurse Navigators Direct Office Dial: [phone]   For scheduling needs, including cancellations and rescheduling, please call [place], [phone].    Follow-Up: At [facility], you and your health needs are our priority.  As part of our continuing mission to provide you with exceptional heart care, we have created designated Provider Care Teams.  These Care Teams include your primary Cardiologist (physician) and Advanced Practice Providers (APPs -  Physician Assistants and Nurse Practitioners) who all work together to provide you with the care you need, when you need it.  We recommend signing up for the patient portal called "MyChart".  Sign up information is provided on this After Visit Summary.  MyChart is used to connect with patients for Virtual Visits (Telemedicine).  Patients are able to view lab/test results, encounter notes, upcoming appointments, etc.  Non-urgent messages can be sent to your provider as well.   To learn more about what you can do with MyChart, go to [email or web address].    Your next appointment:   2 month(s)  Provider:   Bryan Lemma, MD or Carlos Levering   Other Instructions We will request your labs from Dr. Yetta Barre

## 2023-04-15 ENCOUNTER — Encounter (HOSPITAL_COMMUNITY): Payer: Self-pay

## 2023-04-19 ENCOUNTER — Telehealth (HOSPITAL_COMMUNITY): Payer: Self-pay | Admitting: Emergency Medicine

## 2023-04-19 MED ORDER — METOPROLOL TARTRATE 100 MG PO TABS: ORAL_TABLET | ORAL | 0 refills | Status: DC

## 2023-04-19 NOTE — Telephone Encounter (Signed)
Pt unavailable for ccta tomorrow 10/1 as scheduled,  New appt made for 10/11. Rx for metop resent to pharm on file per pt request  Rockwell Alexandria RN Navigator Cardiac Imaging [facility] [phone] Office  [phone] Cell

## 2023-04-20 ENCOUNTER — Ambulatory Visit (HOSPITAL_COMMUNITY): Admission: RE | Admit: 2023-04-20 | Payer: 59 | Source: Ambulatory Visit

## 2023-04-28 ENCOUNTER — Telehealth (HOSPITAL_COMMUNITY): Payer: Self-pay | Admitting: *Deleted

## 2023-04-28 NOTE — Telephone Encounter (Signed)
Attempted to call patient regarding upcoming cardiac CT appointment. Unable to leave a message.  Jelani Trueba RN Navigator Cardiac Imaging [place] [facility] [phone] Office [phone] Cell  

## 2023-04-29 ENCOUNTER — Telehealth (HOSPITAL_COMMUNITY): Payer: Self-pay | Admitting: *Deleted

## 2023-04-29 NOTE — Telephone Encounter (Signed)
Attempted to call patient regarding upcoming cardiac CT appointment. Unable to leave VM. Damareon Lanni RN Navigator Cardiac Imaging [facility] [phone] Office  

## 2023-04-30 ENCOUNTER — Ambulatory Visit (HOSPITAL_COMMUNITY): Admission: RE | Admit: 2023-04-30 | Payer: 59 | Source: Ambulatory Visit

## 2023-06-09 ENCOUNTER — Encounter (HOSPITAL_COMMUNITY): Payer: Self-pay

## 2023-06-16 ENCOUNTER — Ambulatory Visit: Payer: 59 | Attending: Cardiology | Admitting: Cardiology

## 2023-06-29 ENCOUNTER — Encounter (HOSPITAL_COMMUNITY): Payer: Self-pay

## 2023-06-30 ENCOUNTER — Telehealth (HOSPITAL_COMMUNITY): Payer: Self-pay | Admitting: *Deleted

## 2023-06-30 NOTE — Telephone Encounter (Signed)
Attempted to call patient regarding upcoming cardiac CT appointment. Unable to leave VM. Damareon Lanni RN Navigator Cardiac Imaging [facility] [phone] Office  

## 2023-07-01 ENCOUNTER — Ambulatory Visit (HOSPITAL_COMMUNITY): Admission: RE | Admit: 2023-07-01 | Payer: 59 | Source: Ambulatory Visit

## 2023-07-16 ENCOUNTER — Other Ambulatory Visit (HOSPITAL_COMMUNITY): Payer: Self-pay | Admitting: *Deleted

## 2023-07-16 ENCOUNTER — Telehealth (HOSPITAL_COMMUNITY): Payer: Self-pay | Admitting: *Deleted

## 2023-07-16 MED ORDER — METOPROLOL TARTRATE 100 MG PO TABS: ORAL_TABLET | ORAL | 0 refills | Status: DC

## 2023-07-16 NOTE — Telephone Encounter (Signed)
Reaching out to patient to offer assistance regarding upcoming cardiac imaging study; pt verbalizes understanding of appt date/time, parking situation and where to check in, pre-test NPO status and medications ordered, and verified current allergies; name and call back number provided for further questions should they arise  Larey Brick RN Navigator Cardiac Imaging [facility] [phone] office [phone] cell  Patient to take 100mg  metoprolol tartrate two hours prior to his cardiac CT scan. He is aware to arrive at 7:30 AM.

## 2023-07-19 ENCOUNTER — Ambulatory Visit (HOSPITAL_COMMUNITY)
Admission: RE | Admit: 2023-07-19 | Discharge: 2023-07-19 | Disposition: A | Discharge status: Home / Self Care | Payer: 59 | Source: Ambulatory Visit | Attending: Student | Admitting: Student

## 2023-07-19 DIAGNOSIS — R072 Precordial pain: Secondary | ICD-10-CM | POA: Diagnosis not present

## 2023-07-19 DIAGNOSIS — R079 Chest pain, unspecified: Secondary | ICD-10-CM | POA: Diagnosis present

## 2023-07-19 LAB — POCT I-STAT CREATININE: Creatinine, Ser: 1.2 mg/dL (ref 0.61–1.24)

## 2023-07-19 MED ORDER — IOHEXOL 350 MG/ML SOLN
95.0000 mL | Freq: Once | INTRAVENOUS | Status: AC | PRN
  Administered 2023-07-19: 95 mL via INTRAVENOUS

## 2023-07-19 MED ORDER — NITROGLYCERIN 0.4 MG SL SUBL
SUBLINGUAL_TABLET | SUBLINGUAL | Status: AC
  Filled 2023-07-19: qty 2

## 2023-07-19 MED ORDER — NITROGLYCERIN 0.4 MG SL SUBL
0.8000 mg | SUBLINGUAL_TABLET | Freq: Once | SUBLINGUAL | Status: AC
Start: 2023-07-19 — End: 2023-07-19
  Administered 2023-07-19: 0.8 mg via SUBLINGUAL

## 2023-12-05 ENCOUNTER — Inpatient Hospital Stay (HOSPITAL_COMMUNITY)
Admission: EM | Admit: 2023-12-05 | Discharge: 2023-12-07 | DRG: 369 | Disposition: A | Discharge status: Home / Self Care | Attending: Internal Medicine | Admitting: Internal Medicine

## 2023-12-05 ENCOUNTER — Other Ambulatory Visit: Payer: Self-pay

## 2023-12-05 ENCOUNTER — Emergency Department (HOSPITAL_COMMUNITY)

## 2023-12-05 ENCOUNTER — Encounter (HOSPITAL_COMMUNITY): Payer: Self-pay

## 2023-12-05 DIAGNOSIS — K2101 Gastro-esophageal reflux disease with esophagitis, with bleeding: Principal | ICD-10-CM | POA: Diagnosis present

## 2023-12-05 DIAGNOSIS — E1165 Type 2 diabetes mellitus with hyperglycemia: Secondary | ICD-10-CM | POA: Diagnosis present

## 2023-12-05 DIAGNOSIS — N182 Chronic kidney disease, stage 2 (mild): Secondary | ICD-10-CM | POA: Diagnosis present

## 2023-12-05 DIAGNOSIS — K209 Esophagitis, unspecified without bleeding: Secondary | ICD-10-CM | POA: Diagnosis present

## 2023-12-05 DIAGNOSIS — F17290 Nicotine dependence, other tobacco product, uncomplicated: Secondary | ICD-10-CM | POA: Diagnosis present

## 2023-12-05 DIAGNOSIS — Z8249 Family history of ischemic heart disease and other diseases of the circulatory system: Secondary | ICD-10-CM

## 2023-12-05 DIAGNOSIS — R911 Solitary pulmonary nodule: Secondary | ICD-10-CM | POA: Diagnosis present

## 2023-12-05 DIAGNOSIS — Z888 Allergy status to other drugs, medicaments and biological substances status: Secondary | ICD-10-CM

## 2023-12-05 DIAGNOSIS — I493 Ventricular premature depolarization: Secondary | ICD-10-CM | POA: Diagnosis present

## 2023-12-05 DIAGNOSIS — Z79899 Other long term (current) drug therapy: Secondary | ICD-10-CM

## 2023-12-05 DIAGNOSIS — N400 Enlarged prostate without lower urinary tract symptoms: Secondary | ICD-10-CM | POA: Diagnosis present

## 2023-12-05 DIAGNOSIS — E1122 Type 2 diabetes mellitus with diabetic chronic kidney disease: Secondary | ICD-10-CM | POA: Diagnosis present

## 2023-12-05 DIAGNOSIS — F17210 Nicotine dependence, cigarettes, uncomplicated: Secondary | ICD-10-CM | POA: Diagnosis present

## 2023-12-05 DIAGNOSIS — R7989 Other specified abnormal findings of blood chemistry: Secondary | ICD-10-CM | POA: Diagnosis not present

## 2023-12-05 DIAGNOSIS — R000 Tachycardia, unspecified: Secondary | ICD-10-CM

## 2023-12-05 DIAGNOSIS — K311 Adult hypertrophic pyloric stenosis: Secondary | ICD-10-CM | POA: Diagnosis present

## 2023-12-05 DIAGNOSIS — E119 Type 2 diabetes mellitus without complications: Secondary | ICD-10-CM

## 2023-12-05 DIAGNOSIS — R1013 Epigastric pain: Secondary | ICD-10-CM

## 2023-12-05 DIAGNOSIS — I517 Cardiomegaly: Secondary | ICD-10-CM | POA: Diagnosis present

## 2023-12-05 DIAGNOSIS — E663 Overweight: Secondary | ICD-10-CM | POA: Diagnosis present

## 2023-12-05 DIAGNOSIS — E785 Hyperlipidemia, unspecified: Secondary | ICD-10-CM | POA: Diagnosis present

## 2023-12-05 DIAGNOSIS — Z833 Family history of diabetes mellitus: Secondary | ICD-10-CM

## 2023-12-05 DIAGNOSIS — I129 Hypertensive chronic kidney disease with stage 1 through stage 4 chronic kidney disease, or unspecified chronic kidney disease: Secondary | ICD-10-CM | POA: Diagnosis present

## 2023-12-05 DIAGNOSIS — Z6828 Body mass index (BMI) 28.0-28.9, adult: Secondary | ICD-10-CM

## 2023-12-05 DIAGNOSIS — Z7984 Long term (current) use of oral hypoglycemic drugs: Secondary | ICD-10-CM

## 2023-12-05 DIAGNOSIS — Z794 Long term (current) use of insulin: Secondary | ICD-10-CM

## 2023-12-05 LAB — CBC WITH DIFFERENTIAL/PLATELET
Abs Immature Granulocytes: 0.04 10*3/uL (ref 0.00–0.07)
Basophils Absolute: 0.1 10*3/uL (ref 0.0–0.1)
Basophils Relative: 1 %
Eosinophils Absolute: 0 10*3/uL (ref 0.0–0.5)
Eosinophils Relative: 0 %
HCT: 54.9 % — ABNORMAL HIGH (ref 39.0–52.0)
Hemoglobin: 18.9 g/dL — ABNORMAL HIGH (ref 13.0–17.0)
Immature Granulocytes: 0 %
Lymphocytes Relative: 32 %
Lymphs Abs: 3.2 10*3/uL (ref 0.7–4.0)
MCH: 29.3 pg (ref 26.0–34.0)
MCHC: 34.4 g/dL (ref 30.0–36.0)
MCV: 85 fL (ref 80.0–100.0)
Monocytes Absolute: 0.9 10*3/uL (ref 0.1–1.0)
Monocytes Relative: 9 %
Neutro Abs: 5.6 10*3/uL (ref 1.7–7.7)
Neutrophils Relative %: 58 %
Platelets: 233 10*3/uL (ref 150–400)
RBC: 6.46 MIL/uL — ABNORMAL HIGH (ref 4.22–5.81)
RDW: 11.9 % (ref 11.5–15.5)
WBC: 9.8 10*3/uL (ref 4.0–10.5)
nRBC: 0 % (ref 0.0–0.2)

## 2023-12-05 LAB — TROPONIN I (HIGH SENSITIVITY)
Troponin I (High Sensitivity): 73 ng/L — ABNORMAL HIGH (ref <18)
Troponin I (High Sensitivity): 81 ng/L — ABNORMAL HIGH (ref <18)
Troponin I (High Sensitivity): 98 ng/L — ABNORMAL HIGH (ref <18)
Troponin I (High Sensitivity): 90 ng/L — ABNORMAL HIGH (ref <18)

## 2023-12-05 LAB — COMPREHENSIVE METABOLIC PANEL WITH GFR
ALT: 24 U/L (ref 0–44)
AST: 19 U/L (ref 15–41)
Albumin: 3.6 g/dL (ref 3.5–5.0)
Alkaline Phosphatase: 66 U/L (ref 38–126)
Anion gap: 8 (ref 5–15)
BUN: 26 mg/dL — ABNORMAL HIGH (ref 6–20)
CO2: 24 mmol/L (ref 22–32)
Calcium: 9 mg/dL (ref 8.9–10.3)
Chloride: 103 mmol/L (ref 98–111)
Creatinine, Ser: 1.17 mg/dL (ref 0.61–1.24)
GFR, Estimated: >60 mL/min (ref >60)
Glucose, Bld: 146 mg/dL — ABNORMAL HIGH (ref 70–99)
Potassium: 3.6 mmol/L (ref 3.5–5.1)
Sodium: 135 mmol/L (ref 135–145)
Total Bilirubin: 1.5 mg/dL — ABNORMAL HIGH (ref 0.0–1.2)
Total Protein: 6.6 g/dL (ref 6.5–8.1)

## 2023-12-05 LAB — GLUCOSE, CAPILLARY
Glucose-Capillary: 157 mg/dL — ABNORMAL HIGH (ref 70–99)
Glucose-Capillary: 148 mg/dL — ABNORMAL HIGH (ref 70–99)

## 2023-12-05 LAB — HEMOGLOBIN A1C
Hgb A1c MFr Bld: 6.7 % — ABNORMAL HIGH (ref 4.8–5.6)
Mean Plasma Glucose: 145.59 mg/dL

## 2023-12-05 LAB — LIPASE, BLOOD: Lipase: 41 U/L (ref 11–51)

## 2023-12-05 LAB — BRAIN NATRIURETIC PEPTIDE: B Natriuretic Peptide: 157.5 pg/mL — ABNORMAL HIGH (ref 0.0–100.0)

## 2023-12-05 MED ORDER — ONDANSETRON HCL 4 MG/2ML IJ SOLN
4.0000 mg | Freq: Four times a day (QID) | INTRAMUSCULAR | Status: DC | PRN
  Administered 2023-12-06: 4 mg via INTRAVENOUS
  Filled 2023-12-05: qty 2

## 2023-12-05 MED ORDER — ACETAMINOPHEN 650 MG RE SUPP: 650.0000 mg | Freq: Four times a day (QID) | RECTAL | Status: DC | PRN

## 2023-12-05 MED ORDER — INSULIN ASPART 100 UNIT/ML IJ SOLN
0.0000 [IU] | Freq: Three times a day (TID) | INTRAMUSCULAR | Status: DC
  Administered 2023-12-05: 2 [IU] via SUBCUTANEOUS
  Administered 2023-12-07: 1 [IU] via SUBCUTANEOUS

## 2023-12-05 MED ORDER — DILTIAZEM HCL ER COATED BEADS 120 MG PO CP24
120.0000 mg | ORAL_CAPSULE | Freq: Every day | ORAL | Status: DC
  Administered 2023-12-05 – 2023-12-07 (×3): 120 mg via ORAL
  Filled 2023-12-05 (×3): qty 1

## 2023-12-05 MED ORDER — ALUM & MAG HYDROXIDE-SIMETH 200-200-20 MG/5ML PO SUSP: 30.0000 mL | Freq: Four times a day (QID) | ORAL | Status: DC | PRN

## 2023-12-05 MED ORDER — ONDANSETRON 4 MG PO TBDP
4.0000 mg | ORAL_TABLET | Freq: Once | ORAL | Status: AC
  Administered 2023-12-05: 4 mg via ORAL
  Filled 2023-12-05: qty 1

## 2023-12-05 MED ORDER — ATORVASTATIN CALCIUM 40 MG PO TABS
40.0000 mg | ORAL_TABLET | Freq: Every day | ORAL | Status: DC
  Administered 2023-12-05 – 2023-12-07 (×3): 40 mg via ORAL
  Filled 2023-12-05 (×3): qty 1

## 2023-12-05 MED ORDER — ALUM & MAG HYDROXIDE-SIMETH 200-200-20 MG/5ML PO SUSP
30.0000 mL | Freq: Once | ORAL | Status: AC
  Administered 2023-12-05: 30 mL via ORAL
  Filled 2023-12-05: qty 30

## 2023-12-05 MED ORDER — TAMSULOSIN HCL 0.4 MG PO CAPS
0.4000 mg | ORAL_CAPSULE | Freq: Every day | ORAL | Status: DC
  Administered 2023-12-05 – 2023-12-06 (×2): 0.4 mg via ORAL
  Filled 2023-12-05 (×2): qty 1

## 2023-12-05 MED ORDER — ASPIRIN 325 MG PO TABS
325.0000 mg | ORAL_TABLET | Freq: Once | ORAL | Status: AC
  Administered 2023-12-05: 325 mg via ORAL
  Filled 2023-12-05: qty 1

## 2023-12-05 MED ORDER — PANTOPRAZOLE SODIUM 40 MG IV SOLR
40.0000 mg | Freq: Every day | INTRAVENOUS | Status: DC
  Administered 2023-12-05 – 2023-12-06 (×2): 40 mg via INTRAVENOUS
  Filled 2023-12-05 (×2): qty 10

## 2023-12-05 MED ORDER — NITROGLYCERIN 0.4 MG SL SUBL
0.4000 mg | SUBLINGUAL_TABLET | SUBLINGUAL | Status: DC | PRN
  Administered 2023-12-05: 0.4 mg via SUBLINGUAL
  Filled 2023-12-05: qty 1

## 2023-12-05 MED ORDER — LIDOCAINE VISCOUS HCL 2 % MT SOLN
15.0000 mL | Freq: Once | OROMUCOSAL | Status: AC
  Administered 2023-12-05: 15 mL via OROMUCOSAL
  Filled 2023-12-05: qty 15

## 2023-12-05 MED ORDER — SODIUM CHLORIDE 0.9 % IV SOLN: INTRAVENOUS | Status: DC

## 2023-12-05 MED ORDER — FAMOTIDINE IN NACL 20-0.9 MG/50ML-% IV SOLN
20.0000 mg | Freq: Once | INTRAVENOUS | Status: AC
  Administered 2023-12-05: 20 mg via INTRAVENOUS
  Filled 2023-12-05: qty 50

## 2023-12-05 MED ORDER — ENOXAPARIN SODIUM 40 MG/0.4ML IJ SOSY
40.0000 mg | PREFILLED_SYRINGE | INTRAMUSCULAR | Status: DC
  Administered 2023-12-05 – 2023-12-06 (×2): 40 mg via SUBCUTANEOUS
  Filled 2023-12-05 (×2): qty 0.4

## 2023-12-05 MED ORDER — EMPAGLIFLOZIN 25 MG PO TABS
25.0000 mg | ORAL_TABLET | Freq: Every day | ORAL | Status: DC
  Administered 2023-12-05 – 2023-12-07 (×2): 25 mg via ORAL
  Filled 2023-12-05 (×3): qty 1

## 2023-12-05 MED ORDER — SPIRONOLACTONE 25 MG PO TABS
25.0000 mg | ORAL_TABLET | Freq: Every day | ORAL | Status: DC
  Administered 2023-12-05: 25 mg via ORAL
  Filled 2023-12-05: qty 1

## 2023-12-05 MED ORDER — SEMAGLUTIDE 14 MG PO TABS
1.0000 | ORAL_TABLET | Freq: Every day | ORAL | Status: DC
Start: 2023-12-05 — End: 2023-12-05

## 2023-12-05 MED ORDER — ONDANSETRON HCL 4 MG PO TABS: 4.0000 mg | ORAL_TABLET | Freq: Four times a day (QID) | ORAL | Status: DC | PRN

## 2023-12-05 MED ORDER — SODIUM CHLORIDE 0.9 % IV BOLUS
1000.0000 mL | Freq: Once | INTRAVENOUS | Status: AC
  Administered 2023-12-05: 1000 mL via INTRAVENOUS

## 2023-12-05 MED ORDER — INSULIN GLARGINE-YFGN 100 UNIT/ML ~~LOC~~ SOLN
5.0000 [IU] | Freq: Every day | SUBCUTANEOUS | Status: DC
  Administered 2023-12-05 – 2023-12-06 (×2): 5 [IU] via SUBCUTANEOUS
  Filled 2023-12-05 (×3): qty 0.05

## 2023-12-05 MED ORDER — IOHEXOL 350 MG/ML SOLN
100.0000 mL | Freq: Once | INTRAVENOUS | Status: AC | PRN
  Administered 2023-12-05: 100 mL via INTRAVENOUS

## 2023-12-05 MED ORDER — KETOROLAC TROMETHAMINE 30 MG/ML IJ SOLN
30.0000 mg | Freq: Once | INTRAMUSCULAR | Status: AC
  Administered 2023-12-05: 30 mg via INTRAVENOUS
  Filled 2023-12-05: qty 1

## 2023-12-05 MED ORDER — ACETAMINOPHEN 325 MG PO TABS: 650.0000 mg | ORAL_TABLET | Freq: Four times a day (QID) | ORAL | Status: DC | PRN

## 2023-12-05 NOTE — ED Triage Notes (Signed)
 BP elevated, patient has h/o HTN Hasn't taken medications or eaten in days

## 2023-12-05 NOTE — ED Notes (Addendum)
 Pt ambulated independently to room, slow steady gait noted

## 2023-12-05 NOTE — ED Provider Notes (Signed)
  Physical Exam  BP 118/87   Pulse 89   Temp 98.4 F (36.9 C) (Oral)   Resp 13   Ht 6' (1.829 m)   Wt 95.7 kg   SpO2 99%   BMI 28.62 kg/m   Physical Exam  Procedures  Procedures  ED Course / MDM    Medical Decision Making Care assumed 3:30 PM.  Patient is here with pain after eating.  CT scan today showed esophagitis.  Initial troponin was 73 and went up to 81.  Previous provider consulted cardiology, Dr. Mallipedi, who recommended a third troponin  5:02 PM Third troponin is up to 98.  I discussed with cardiology again.  She states that patient needs to be admitted for observation.  She states that patient can stay here and cardiology will see patient in the morning and hospitalist to admit.  She wants to hold off on heparin for now.  Problems Addressed: Elevated troponin: acute illness or injury  Amount and/or Complexity of Data Reviewed Labs: ordered. Decision-making details documented in ED Course. Radiology: ordered and independent interpretation performed. Decision-making details documented in ED Course. ECG/medicine tests: ordered and independent interpretation performed. Decision-making details documented in ED Course.  Risk OTC drugs. Prescription drug management.          Dalene Duck, MD 12/05/23 [phone]

## 2023-12-05 NOTE — ED Provider Notes (Signed)
 [place] EMERGENCY DEPARTMENT AT [facility] Provider Note   CSN: [id number] Arrival date & time: 12/05/23  4098     History  Chief Complaint  Patient presents with   Airway Obstruction    Samuel KUE Sr. is a 53 y.o. male.  HPI   53 year old male presents emergency department for upper abdomen/lower chest pain.  Patient states a week ago he was eating a meal consisting of box feels.  It started to taste.  So he stopped.  Since then he has been having lower sternal discomfort and pain with swallowing.  He has had no vomiting but he endorses severe nausea and at times shortness of breath.  He has had a decreased appetite, but tolerating fluids.  Denies any diarrhea.  No documented fever but endorses fatigue and chills.  Home Medications Prior to Admission medications   Medication Sig Start Date End Date Taking? Authorizing Provider  atorvastatin  (LIPITOR) 40 MG tablet Take 1 tablet (40 mg total) by mouth daily. 04/02/23   Morey Ar, NP  diltiazem  (CARDIZEM  CD) 120 MG 24 hr capsule Take 1 capsule (120 mg total) by mouth daily. 04/02/23 07/01/23  Morey Ar, NP  JARDIANCE 25 MG TABS tablet Take 25 mg by mouth daily. 04/02/23   [provider]  LANTUS SOLOSTAR 100 UNIT/ML Solostar Pen Inject 10 Units into the skin at bedtime. 10/07/22   [provider]  metoprolol  tartrate (LOPRESSOR ) 100 MG tablet Take 1 tablet (100mg ) TWO hours prior to CT scan 07/16/23   Morey Ar, NP  multivitamin (ONE-A-DAY MEN'S) TABS Take 1 tablet by mouth daily.    [provider]  RYBELSUS 14 MG TABS Take 1 tablet by mouth daily. 03/22/23   [provider]  saxagliptin HCl (ONGLYZA) 5 MG TABS tablet Take by mouth daily.    [provider]  spironolactone  (ALDACTONE ) 25 MG tablet Take 1 tablet (25 mg total) by mouth daily. Schedule an appointment for further refills, second attempt 04/02/23   Morey Ar, NP  tamsulosin  (FLOMAX) 0.4 MG CAPS capsule Take 0.4 mg by mouth at bedtime. 03/05/23   [provider]  valsartan  (DIOVAN ) 320 MG tablet Take 1 tablet (320 mg total) by mouth daily. 04/02/23   Morey Ar, NP  Vitamin D, Ergocalciferol, (DRISDOL) 1.25 MG (50000 UNIT) CAPS capsule Take 50,000 Units by mouth once a week. 06/10/20   [provider]      Allergies    Metformin and related    Review of Systems   Review of Systems  Constitutional:  Positive for appetite change, chills and fatigue. Negative for fever.  Respiratory:  Positive for shortness of breath.   Cardiovascular:  Positive for chest pain. Negative for leg swelling.  Gastrointestinal:  Positive for abdominal pain and nausea. Negative for diarrhea and vomiting.  Musculoskeletal:  Negative for back pain.  Skin:  Negative for rash.  Neurological:  Negative for headaches.    Physical Exam Updated Vital Signs BP (!) 116/103 (BP Location: Right Arm)   Pulse (!) 109   Temp 98.5 F (36.9 C) (Oral)   Resp (!) 8   Ht 6' (1.829 m)   Wt 95.7 kg   SpO2 100%   BMI 28.62 kg/m  Physical Exam Vitals and nursing note reviewed.  Constitutional:      General: He is not in acute distress.    Appearance: Normal appearance.  HENT:     Head: Normocephalic.     Mouth/Throat:  Mouth: Mucous membranes are moist.  Cardiovascular:     Rate and Rhythm: Tachycardia present.     Heart sounds: Murmur heard.     Comments: Systolic murmur, known by patient Pulmonary:     Effort: Pulmonary effort is normal. No respiratory distress.  Abdominal:     Palpations: Abdomen is soft.     Tenderness: There is abdominal tenderness.     Comments: Epigastric TTP, negative Murphy sign  Skin:    General: Skin is warm.  Neurological:     Mental Status: He is alert and oriented to person, place, and time. Mental status is at baseline.  Psychiatric:        Mood and Affect: Mood normal.     ED Results / Procedures / Treatments    Labs (all labs ordered are listed, but only abnormal results are displayed) Labs Reviewed  CBC WITH DIFFERENTIAL/PLATELET  COMPREHENSIVE METABOLIC PANEL WITH GFR  LIPASE, BLOOD  TROPONIN I (HIGH SENSITIVITY)    EKG EKG Interpretation Date/Time:  Sunday Dec 05 2023 10:04:35 EDT Ventricular Rate:  116 PR Interval:  142 QRS Duration:  83 QT Interval:  328 QTC Calculation: 456 R Axis:   36  Text Interpretation: Sinus tachycardia RSR' in V1 or V2, probably normal variant Abnormal T, consider ischemia, lateral leads similar to previous Confirmed by Florentino Hurdle [id number]) on 12/05/2023 10:18:32 AM  Radiology No results found.  Procedures Procedures    Medications Ordered in ED Medications  sodium chloride 0.9 % bolus 1,000 mL (has no administration in time range)  ondansetron  (ZOFRAN -ODT) disintegrating tablet 4 mg (has no administration in time range)  ketorolac (TORADOL) 30 MG/ML injection 30 mg (has no administration in time range)    ED Course/ Medical Decision Making/ A&P                                 Medical Decision Making Amount and/or Complexity of Data Reviewed Labs: ordered. Radiology: ordered.  Risk Prescription drug management.   53 year old male presents emergency department with lower sternal/upper abdominal pain.  This all started a week ago after eating a meal.  He has painful swallowing which is now associated with shortness of breath.  Patient was tachycardic on arrival, admits to decreased p.o. intake.  Initial blood work is reassuring with no acute abnormalities, abdominal labs are negative.  Chest x-ray shows no acute finding.  EKG does not show any acute ischemic changes, cardiac enzymes added on.  Initial troponin is elevated and BNP is slightly elevated as well.  Patient continues to have epigastric discomfort, improved with nitroglycerin  and other medications.  Repeat troponin elevated to 81.  Patient follows with Dr. Addie Holstein for cardiology.   Consulted cardiology, Dr. Malllipeddi, who reviewed his case.  At this time with no significant delta troponin they do not feel that this is acute ACS.  Feel that the discomfort is most likely secondary to esophagitis.  Patient recently has a CT coronary from December that was reviewed.  Will continue to treat the patient's discomfort.  Will opt for a third troponin, if no significant delta we will plan for outpatient cardiology follow-up.        Final Clinical Impression(s) / ED Diagnoses Final diagnoses:  None    Rx / DC Orders ED Discharge Orders     None         Flonnie Humphrey, DO 12/05/23 1523

## 2023-12-05 NOTE — H&P (Signed)
 History and Physical    Patient: Samuel Gutierrez ZOX:[id number] DOB: [birth year] DOA: 12/05/2023 DOS: the patient was seen and examined on 12/05/2023 PCP: Trellis Fries, MD  Patient coming from: Home  Chief Complaint: burning chest discomfort  HPI: Samuel Gutierrez Sr. is a 53 y.o. male with medical history significant of HTN, T2DM, HLD, CKD stage II, BPH presenting with burning chest discomfort.  Patient reports that he was in his usual state of health until last Saturday.  He ordered ox tails for dinner and after eating 1 he began feeling unwell.  States that the texture and taste of the ox tail was different than what he was expecting.  He ultimately went to sleep and the next morning after eating breakfast he began having a burning chest discomfort that persisted throughout the day.  Sunday night, he began noticing hiccups that were fairly frequent.  The following day on Monday, he went to see his primary care doctor and was prescribed a medication (unclear which medication) and he took that for a couple of days without any alleviation of his burning discomfort.  He again saw his primary care doctor on Wednesday and was prescribed ibuprofen which also did not help with his burning discomfort.  On Friday, he was referred to GI by his primary care doctor and was scheduled to be seen this following week for potential endoscopy on Tuesday.  He was advised by GI to come into the ED for further evaluation if his burning discomfort did not improve.  Given lack of improvement, he presented today for further evaluation.  He does endorse nausea but has not experienced any vomiting. He also endorses mild generalized stomach pain. He denies any fevers, vomiting, palpitations, shortness of breath, diarrhea, constipation, urinary changes.  He does endorse decreased oral intake over the past week due to his symptoms.  Has not been eating or drinking much during this time.  ED course: Vital signs stable.   Initially notable for tachycardia but this has since resolved.  CBC with elevated hemoglobin at 18.9, likely hemoconcentration.  CMP with glucose 146, creatinine 1.17 (around his baseline).  Lipase normal.  BNP mildly elevated at 157.  Troponin 73 > 81 > 98.  EKG with sinus tachycardia, no obvious acute ischemic changes noted.  ED provider consulted cardiology, who recommended observation stay and will see tomorrow morning.  Discussed with cardiology who believe that this is likely more so due to a GI etiology.  Patient given Maalox, IV Pepcid, Toradol, lidocaine mouth solution, Zofran , IV fluid bolus by ED provider.  [facility] hospitalist asked to evaluate patient for admission.  Review of Systems: As mentioned in the history of present illness. All other systems reviewed and are negative. Past Medical History:  Diagnosis Date   Abnormal resting ECG findings    LVH with repolarization abnormalities.   Benign essential HTN    Has a history of medical nonadherence   LVH (left ventricular hypertrophy)    borderline   Past Surgical History:  Procedure Laterality Date   TRANSTHORACIC ECHOCARDIOGRAM  10/2016   Normal LV size with vigorous motion.  EF 65-70%.  No dynamic obstruction, but hyperdynamic LV.  Increased flow velocities intracavity.  This is likely the cause of the murmur.  No evidence of valvular or subvalvular disease.  GR 1 DD   Social History:  reports that he has been smoking cigars. He has never used smokeless tobacco. He reports that he does not drink alcohol and does not use  drugs.  Allergies  Allergen Reactions   Metformin And Related     Gastric upset    Family History  Problem Relation Age of Onset   Hypertension Mother        2 Maternal uncles also HTN & DM   Diabetes Mellitus II Mother    Hypertension Maternal Grandmother    Diabetes Mellitus II Maternal Grandmother     Prior to Admission medications   Medication Sig Start Date End Date Taking? Authorizing Provider   atorvastatin  (LIPITOR) 40 MG tablet Take 1 tablet (40 mg total) by mouth daily. 04/02/23   Morey Ar, NP  diltiazem  (CARDIZEM  CD) 120 MG 24 hr capsule Take 1 capsule (120 mg total) by mouth daily. 04/02/23 07/01/23  Morey Ar, NP  JARDIANCE 25 MG TABS tablet Take 25 mg by mouth daily. 04/02/23   [provider]  LANTUS SOLOSTAR 100 UNIT/ML Solostar Pen Inject 10 Units into the skin at bedtime. 10/07/22   [provider]  metoprolol  tartrate (LOPRESSOR ) 100 MG tablet Take 1 tablet (100mg ) TWO hours prior to CT scan 07/16/23   Morey Ar, NP  multivitamin (ONE-A-DAY MEN'S) TABS Take 1 tablet by mouth daily.    [provider]  RYBELSUS 14 MG TABS Take 1 tablet by mouth daily. 03/22/23   [provider]  saxagliptin HCl (ONGLYZA) 5 MG TABS tablet Take by mouth daily.    [provider]  spironolactone  (ALDACTONE ) 25 MG tablet Take 1 tablet (25 mg total) by mouth daily. Schedule an appointment for further refills, second attempt 04/02/23   Morey Ar, NP  tamsulosin (FLOMAX) 0.4 MG CAPS capsule Take 0.4 mg by mouth at bedtime. 03/05/23   [provider]  valsartan  (DIOVAN ) 320 MG tablet Take 1 tablet (320 mg total) by mouth daily. 04/02/23   Morey Ar, NP  Vitamin D, Ergocalciferol, (DRISDOL) 1.25 MG (50000 UNIT) CAPS capsule Take 50,000 Units by mouth once a week. 06/10/20   [provider]    Physical Exam: Vitals:   12/05/23 1439 12/05/23 1530 12/05/23 1545 12/05/23 1603  BP: (!) 135/97 120/79 118/87   Pulse: 81 86 89   Resp: 12 (!) 4 13   Temp:    98.4 F (36.9 C)  TempSrc:    Oral  SpO2: 100% 98% 99%   Weight:      Height:       Physical Exam Constitutional:      Appearance: Normal appearance. He is not ill-appearing.  HENT:     Head: Normocephalic and atraumatic.     Mouth/Throat:     Mouth: Mucous membranes are dry.     Pharynx: Oropharynx is clear. No oropharyngeal exudate.   Eyes:     General: No scleral icterus.    Extraocular Movements: Extraocular movements intact.     Conjunctiva/sclera: Conjunctivae normal.     Pupils: Pupils are equal, round, and reactive to light.  Cardiovascular:     Rate and Rhythm: Normal rate and regular rhythm.     Heart sounds: No murmur heard.    No friction rub. No gallop.     Comments: Systolic murmur present, chronic. Pulmonary:     Effort: Pulmonary effort is normal. No respiratory distress.     Breath sounds: Normal breath sounds. No wheezing, rhonchi or rales.  Abdominal:     General: Bowel sounds are normal. There is no distension.     Palpations: Abdomen is soft.     Tenderness: There is no guarding or  rebound.     Comments: Mild generalized abdominal tenderness to palpation.  Musculoskeletal:        General: No swelling. Normal range of motion.     Cervical back: Normal range of motion.  Skin:    General: Skin is warm and dry.  Neurological:     General: No focal deficit present.     Mental Status: He is alert and oriented to person, place, and time.  Psychiatric:        Mood and Affect: Mood normal.        Behavior: Behavior normal.     Data Reviewed:  There are no new results to review at this time.    Latest Ref Rng & Units 12/05/2023   10:11 AM 10/23/2022    9:22 AM 08/23/2020    3:41 PM  CBC  WBC 4.0 - 10.5 K/uL 9.8  6.3  8.9   Hemoglobin 13.0 - 17.0 g/dL 78.2  95.6  21.3   Hematocrit 39.0 - 52.0 % 54.9  47.2  51.1   Platelets 150 - 400 K/uL 233  218  257       Latest Ref Rng & Units 12/05/2023   10:11 AM 07/19/2023    8:24 AM 10/23/2022    9:22 AM  CMP  Glucose 70 - 99 mg/dL 086   578   BUN 6 - 20 mg/dL 26   11   Creatinine 4.69 - 1.24 mg/dL 6.29  5.28  4.13   Sodium 135 - 145 mmol/L 135   142   Potassium 3.5 - 5.1 mmol/L 3.6   3.9   Chloride 98 - 111 mmol/L 103   107   CO2 22 - 32 mmol/L 24   20   Calcium  8.9 - 10.3 mg/dL 9.0   8.4   Total Protein 6.5 - 8.1 g/dL 6.6     Total Bilirubin  0.0 - 1.2 mg/dL 1.5     Alkaline Phos 38 - 126 U/L 66     AST 15 - 41 U/L 19     ALT 0 - 44 U/L 24      BNP    Component Value Date/Time   BNP 157.5 (H) 12/05/2023 1011   Lipase     Component Value Date/Time   LIPASE 41 12/05/2023 1011   Troponin 73 > 81 > 98  CT Angio Chest/Abd/Pel for Dissection W and/or W/WO Result Date: 12/05/2023 CLINICAL DATA:  Aortic aneurysm suspected EXAM: CT ANGIOGRAPHY CHEST, ABDOMEN AND PELVIS TECHNIQUE: Non-contrast CT of the chest was initially obtained. Multidetector CT imaging through the chest, abdomen and pelvis was performed using the standard protocol during bolus administration of intravenous contrast. Multiplanar reconstructed images and MIPs were obtained and reviewed to evaluate the vascular anatomy. RADIATION DOSE REDUCTION: This exam was performed according to the departmental dose-optimization program which includes automated exposure control, adjustment of the mA and/or kV according to patient size and/or use of iterative reconstruction technique. CONTRAST:  OMNIPAQUE  IOHEXOL  350 MG/ML SOLN COMPARISON:  August 24, 2020., July 19, 2023 FINDINGS: CTA CHEST FINDINGS Cardiovascular: Preferential opacification of the thoracic aorta. No evidence of thoracic aortic aneurysm, intramural hematoma or dissection. Limited evaluation of the aortic root secondary to motion. Normal heart size. Trace pericardial fluid. Mediastinum/Nodes: Visualized thyroid is unremarkable. No axillary mediastinal adenopathy. No radiopaque foreign body visualized within the sternum. Punctate calcification along the course of the esophagus, unchanged and likely a calcified hilar lymph node. Lungs/Pleura: No pleural effusion or pneumothorax. Focal area  of emphysematous versus posttraumatic/post inflammatory cystic changes in the LEFT lower lobe. Scattered calcified pulmonary nodules. 3 mm RIGHT middle lobe pulmonary nodule (series 6, image 91). Musculoskeletal: No chest wall  abnormality. No acute or significant osseous findings. Review of the MIP images confirms the above findings. CTA ABDOMEN AND PELVIS FINDINGS VASCULAR Aorta: Normal caliber aorta without aneurysm, dissection, vasculitis or significant stenosis. Celiac: Patent. There is mild narrowing of the origin with poststenotic dilation which could reflect underlying median arcuate ligament syndrome in the appropriate clinical setting. SMA: Patent without evidence of aneurysm, dissection, vasculitis or significant stenosis. Renals: Both renal arteries are patent without evidence of aneurysm, dissection, vasculitis, fibromuscular dysplasia or significant stenosis. IMA: Patent without evidence of aneurysm, dissection, vasculitis or significant stenosis. Inflow: Patent without evidence of aneurysm, dissection, vasculitis or significant stenosis. Veins: No obvious venous abnormality within the limitations of this arterial phase study. Review of the MIP images confirms the above findings. NON-VASCULAR Hepatobiliary: Focal fatty deposition along the falciform ligament. Gallbladder is unremarkable. Pancreas: Unremarkable. No pancreatic ductal dilatation or surrounding inflammatory changes. Spleen: Normal in size without focal abnormality. Adrenals/Urinary Tract: Similar fullness of the adrenal glands. Kidneys enhance symmetrically. No hydronephrosis. No obstructing nephrolithiasis. Bladder is decompressed and unremarkable. Stomach/Bowel: No evidence of bowel obstruction. Appendix is normal. Mild circumferential wall prominence of the distal esophagus with a favored tiny hiatal hernia. Stomach is decompressed. Lymphatic: No suspicious lymphadenopathy. Reproductive: Prostate is unremarkable. Other: No free air or free fluid. Musculoskeletal: No acute or significant osseous findings. Review of the MIP images confirms the above findings. IMPRESSION: 1. No evidence of aortic aneurysm, intramural hematoma or dissection. 2. No radiopaque  foreign body visualized within the mediastinum. 3. There is mild circumferential wall prominence of the distal esophagus with a favored tiny hiatal hernia. This could reflect esophagitis. 4. Right solid pulmonary nodule measuring 3 mm. Per Fleischner Society Guidelines, no routine follow-up imaging is recommended. These guidelines do not apply to immunocompromised patients and patients with cancer. Follow up in patients with significant comorbidities as clinically warranted. For lung cancer screening, adhere to Lung-RADS guidelines. Reference: Radiology. 2017; 284(1):228-43. Electronically Signed   By: Clancy Crimes M.D.   On: 12/05/2023 13:51   DG Chest Port 1 View Result Date: 12/05/2023 CLINICAL DATA:  Shortness of breath.  Hypertension. EXAM: PORTABLE CHEST 1 VIEW COMPARISON:  None Available. FINDINGS: The heart size and mediastinal contours are within normal limits. Both lungs are clear. The visualized skeletal structures are unremarkable. IMPRESSION: No active disease. Electronically Signed   By: Marlyce Sine M.D.   On: 12/05/2023 10:31     Assessment and Plan: No notes have been filed under this hospital service. Service: Hospitalist  Esophagitis Tiny hiatal hernia Patient presenting with 1 week history of burning chest discomfort with associated nausea and mild generalized abdominal pain.  Lipase normal.  CT imaging negative for aortic aneurysm or dissection.  CT imaging does reveal esophagitis and a tiny hiatal hernia, which are likely the cause of patient's burning chest discomfort.  His pain has resolved with IV Pepcid, Maalox, and lidocaine mouthwash.  GI, Dr. Honey Lusty, consulted and will see tomorrow morning. - GI consulted, appreciate assistance - IV Protonix daily - Maalox/Mylanta every 6 hours as needed - Zofran  every 6 hours as needed - s/p 1L IVF bolus, will start maintenance IVF at 75cc/hr for 12 hours - Clear liquid diet, n.p.o. at midnight - Lovenox for DVT  prophylaxis - Place in observation  Elevated troponin Patient with uptrending troponins (  73 > 81 > 98).  Discussed with cardiology, advised that this is likely demand ischemia secondary to GI etiology.  Low concern for ACS.  EKG without any acute ischemic changes.  Will obtain echocardiogram and cardiology will see tomorrow morning.  Per cardiology, no indication for heparin therapy at this time. - Continue trending troponins until peak - Cardiology consulted, appreciate assistance - Follow-up echocardiogram  Hypertension Blood pressures ranging in the 110s-130s/80s-90s.  Patient is on diltiazem  120 mg daily, spironolactone  25 mg daily, valsartan  320 mg daily at home.  Will resume home diltiazem  and spironolactone .  Will hold home valsartan  to avoid precipitating hypotension. - Resume home diltiazem  and spironolactone  - Holding home valsartan , can resume tomorrow if becoming hypertensive - Trend BP curve  Type 2 diabetes with hyperglycemia Patient with history of type 2 diabetes, no recent A1c on file.  He is on Jardiance 25 mg daily, Rybelsus 14 mg daily, saxagliptin 5 mg daily, Lantus 10 units nightly at home.  Blood glucose 146 on CMP. - Follow-up A1c - Resume home Jardiance and Rybelsus - Holding home saxagliptin - Semglee 5 units nightly for tonight given he will be n.p.o. at midnight - Sensitive SSI - Trend CBGs, goal 140-180  CKD stage II Baseline creatinine around 1.2.  On admission, creatinine 1.17. - Chronic and stable  Hyperlipidemia - Resume home Lipitor  BPH - Resume home Flomax  3mm R solid pulmonary nodule -noted on CTA imaging, no routine follow up imaging recommended   Advance Care Planning:   Code Status: Full Code   Consults: cardiology, GI  Family Communication: no family at bedside  Severity of Illness: The appropriate patient status for this patient is OBSERVATION. Observation status is judged to be reasonable and necessary in order to provide the  required intensity of service to ensure the patient's safety. The patient's presenting symptoms, physical exam findings, and initial radiographic and laboratory data in the context of their medical condition is felt to place them at decreased risk for further clinical deterioration. Furthermore, it is anticipated that the patient will be medically stable for discharge from the hospital within 2 midnights of admission.   Portions of this note were generated with Scientist, clinical (histocompatibility and immunogenetics). Dictation errors may occur despite best attempts at proofreading.  Author: Kaelie Henigan H Dessie Delcarlo, MD 12/05/2023 5:26 PM  For on call review [email or web address].

## 2023-12-05 NOTE — ED Triage Notes (Signed)
 Patient reports he had meal 1 week ago Feels like it is lodged in sternum Developed hiccup x24 hours Says chest feels bruised Chest pain Trouble with inhalation at times Endo scheduled for Tuesday but was told to come to ED if couldn't make it until then Pain 7/10

## 2023-12-06 ENCOUNTER — Observation Stay (HOSPITAL_COMMUNITY): Admitting: Anesthesiology

## 2023-12-06 ENCOUNTER — Encounter (HOSPITAL_COMMUNITY)
Admission: EM | Disposition: A | Discharge status: Home / Self Care | Payer: Self-pay | Source: Home / Self Care | Attending: Internal Medicine

## 2023-12-06 ENCOUNTER — Observation Stay (HOSPITAL_COMMUNITY)

## 2023-12-06 ENCOUNTER — Encounter (HOSPITAL_COMMUNITY): Payer: Self-pay | Admitting: Student

## 2023-12-06 DIAGNOSIS — I10 Essential (primary) hypertension: Secondary | ICD-10-CM

## 2023-12-06 DIAGNOSIS — F17210 Nicotine dependence, cigarettes, uncomplicated: Secondary | ICD-10-CM

## 2023-12-06 DIAGNOSIS — K2970 Gastritis, unspecified, without bleeding: Secondary | ICD-10-CM | POA: Diagnosis not present

## 2023-12-06 DIAGNOSIS — R000 Tachycardia, unspecified: Secondary | ICD-10-CM

## 2023-12-06 DIAGNOSIS — I517 Cardiomegaly: Secondary | ICD-10-CM

## 2023-12-06 DIAGNOSIS — K209 Esophagitis, unspecified without bleeding: Secondary | ICD-10-CM | POA: Diagnosis not present

## 2023-12-06 DIAGNOSIS — R1013 Epigastric pain: Secondary | ICD-10-CM

## 2023-12-06 DIAGNOSIS — R7989 Other specified abnormal findings of blood chemistry: Principal | ICD-10-CM

## 2023-12-06 DIAGNOSIS — R002 Palpitations: Secondary | ICD-10-CM

## 2023-12-06 DIAGNOSIS — K311 Adult hypertrophic pyloric stenosis: Secondary | ICD-10-CM

## 2023-12-06 DIAGNOSIS — R079 Chest pain, unspecified: Secondary | ICD-10-CM

## 2023-12-06 DIAGNOSIS — E785 Hyperlipidemia, unspecified: Secondary | ICD-10-CM

## 2023-12-06 HISTORY — PX: ESOPHAGOGASTRODUODENOSCOPY: SHX5428

## 2023-12-06 LAB — CBC
HCT: 50.5 % (ref 39.0–52.0)
Hemoglobin: 17.4 g/dL — ABNORMAL HIGH (ref 13.0–17.0)
MCH: 29.7 pg (ref 26.0–34.0)
MCHC: 34.5 g/dL (ref 30.0–36.0)
MCV: 86.2 fL (ref 80.0–100.0)
Platelets: 215 10*3/uL (ref 150–400)
RBC: 5.86 MIL/uL — ABNORMAL HIGH (ref 4.22–5.81)
RDW: 11.8 % (ref 11.5–15.5)
WBC: 8.8 10*3/uL (ref 4.0–10.5)
nRBC: 0 % (ref 0.0–0.2)

## 2023-12-06 LAB — GLUCOSE, CAPILLARY
Glucose-Capillary: 79 mg/dL (ref 70–99)
Glucose-Capillary: 85 mg/dL (ref 70–99)
Glucose-Capillary: 74 mg/dL (ref 70–99)
Glucose-Capillary: 90 mg/dL (ref 70–99)
Glucose-Capillary: 95 mg/dL (ref 70–99)

## 2023-12-06 LAB — ECHOCARDIOGRAM COMPLETE
Area-P 1/2: 6.83 cm2
Calc EF: 82.5 %
Est EF: >75
Height: 72 in
S' Lateral: 2.6 cm
Single Plane A2C EF: 86.3 %
Single Plane A4C EF: 77 %
Weight: 3376 [oz_av]

## 2023-12-06 LAB — BASIC METABOLIC PANEL WITH GFR
Anion gap: 9 (ref 5–15)
BUN: 26 mg/dL — ABNORMAL HIGH (ref 6–20)
CO2: 22 mmol/L (ref 22–32)
Calcium: 8.4 mg/dL — ABNORMAL LOW (ref 8.9–10.3)
Chloride: 104 mmol/L (ref 98–111)
Creatinine, Ser: 1.26 mg/dL — ABNORMAL HIGH (ref 0.61–1.24)
GFR, Estimated: >60 mL/min (ref >60)
Glucose, Bld: 89 mg/dL (ref 70–99)
Potassium: 3.8 mmol/L (ref 3.5–5.1)
Sodium: 135 mmol/L (ref 135–145)

## 2023-12-06 SURGERY — EGD (ESOPHAGOGASTRODUODENOSCOPY)
Anesthesia: Monitor Anesthesia Care

## 2023-12-06 MED ORDER — ALBUMIN HUMAN 5 % IV SOLN: INTRAVENOUS | Status: DC | PRN

## 2023-12-06 MED ORDER — LIDOCAINE HCL (CARDIAC) PF 100 MG/5ML IV SOSY
PREFILLED_SYRINGE | INTRAVENOUS | Status: DC | PRN
  Administered 2023-12-06: 100 mg via INTRAVENOUS

## 2023-12-06 MED ORDER — PROPOFOL 10 MG/ML IV BOLUS
INTRAVENOUS | Status: DC | PRN
  Administered 2023-12-06: 100 mg via INTRAVENOUS
  Administered 2023-12-06: 10 ug/kg/min via INTRAVENOUS

## 2023-12-06 MED ORDER — PERFLUTREN LIPID MICROSPHERE
1.0000 mL | INTRAVENOUS | Status: AC | PRN
  Administered 2023-12-06: 2 mL via INTRAVENOUS

## 2023-12-06 MED ORDER — PHENYLEPHRINE HCL-NACL 20-0.9 MG/250ML-% IV SOLN
INTRAVENOUS | Status: AC
  Filled 2023-12-06: qty 250

## 2023-12-06 MED ORDER — SODIUM CHLORIDE 0.9 % IV SOLN: INTRAVENOUS | Status: DC

## 2023-12-06 MED ORDER — SUCRALFATE 1 GM/10ML PO SUSP
1.0000 g | Freq: Three times a day (TID) | ORAL | Status: DC
  Administered 2023-12-06 – 2023-12-07 (×3): 1 g via ORAL
  Filled 2023-12-06 (×3): qty 10

## 2023-12-06 MED ORDER — PANTOPRAZOLE SODIUM 40 MG IV SOLR
40.0000 mg | Freq: Two times a day (BID) | INTRAVENOUS | Status: DC
  Administered 2023-12-06 – 2023-12-07 (×2): 40 mg via INTRAVENOUS
  Filled 2023-12-06 (×2): qty 10

## 2023-12-06 NOTE — Plan of Care (Signed)

## 2023-12-06 NOTE — Anesthesia Postprocedure Evaluation (Signed)
 Anesthesia Post Note  Patient: Samuel MULLETT Sr.  Procedure(s) Performed: EGD (ESOPHAGOGASTRODUODENOSCOPY)     Patient location during evaluation: PACU Anesthesia Type: MAC Level of consciousness: awake and alert Pain management: pain level controlled Vital Signs Assessment: post-procedure vital signs reviewed and stable Respiratory status: spontaneous breathing, nonlabored ventilation, respiratory function stable and patient connected to nasal cannula oxygen Cardiovascular status: stable and blood pressure returned to baseline Postop Assessment: no apparent nausea or vomiting Anesthetic complications: no  No notable events documented.  Last Vitals:  Vitals:   12/06/23 1555 12/06/23 1600  BP:  126/70  Pulse: (!) 119 (!) 117  Resp: 16 18  Temp:  (!) 36.2 C  SpO2: 95% 95%    Last Pain:  Vitals:   12/06/23 1600  TempSrc: Temporal  PainSc: 0-No pain                 Daniele Yankowski L Brilee Port

## 2023-12-06 NOTE — Transfer of Care (Signed)
 Immediate Anesthesia Transfer of Care Note  Patient: Samuel SCHMUCK Sr.  Procedure(s) Performed: EGD (ESOPHAGOGASTRODUODENOSCOPY)  Patient Location: PACU  Anesthesia Type:General  Level of Consciousness: awake  Airway & Oxygen Therapy: Patient Spontanous Breathing  Post-op Assessment: Report given to RN and Post -op Vital signs reviewed and stable  Post vital signs: Reviewed and stable  Last Vitals:  Vitals Value Taken Time  BP 129/76 12/06/23 1545  Temp    Pulse 123 12/06/23 1549  Resp 18 12/06/23 1549  SpO2 94 % 12/06/23 1549  Vitals shown include unfiled device data.  Last Pain:  Vitals:   12/06/23 1545  TempSrc:   PainSc: 0-No pain         Complications: No notable events documented.

## 2023-12-06 NOTE — TOC Initial Note (Signed)
 Transition of Care (TOC) - Initial/Assessment Note    Patient Details  Name: Samuel SCHERTZER Sr. MRN: [id number] Date of Birth: Jun 15, 1971  Transition of Care [facility]) CM/SW Contact:    Ruben Corolla, RN Phone Number: 12/06/2023, 2:22 PM  Clinical Narrative: d/c plan home.                  Expected Discharge Plan: Home/Self Care Barriers to Discharge: Continued Medical Work up   Patient Goals and CMS Choice Patient states their goals for this hospitalization and ongoing recovery are:: Home CMS Medicare.gov Compare Post Acute Care list provided to:: Patient Choice offered to / list presented to : [name] ownership interest in [facility].provided to:: Patient    Expected Discharge Plan and Services                                              Prior Living Arrangements/Services                       Activities of Daily Living   ADL Screening (condition at time of admission) Independently performs ADLs?: Yes (appropriate for developmental age) Is the patient deaf or have difficulty hearing?: No Does the patient have difficulty seeing, even when wearing glasses/contacts?: No Does the patient have difficulty concentrating, remembering, or making decisions?: No  Permission Sought/Granted                  Emotional Assessment              Admission diagnosis:  Elevated troponin [R79.89] Esophagitis [K20.90] Patient Active Problem List   Diagnosis Date Noted   Elevated troponin 12/06/2023   Sinus tachycardia 12/06/2023   Epigastric pain 12/06/2023   LVH (left ventricular hypertrophy) 12/06/2023   Esophagitis 12/05/2023   Intermittent palpitations 07/02/2020   Hyperlipidemia associated with type 2 diabetes mellitus (HCC) 02/13/2019   Heart murmur 10/07/2016   Essential hypertension    Abnormal resting ECG findings    PCP:  Trellis Fries, MD Pharmacy:   [facility] [phone] [address] [address], [place] - [address]  [address] [place] [address] Phone: [phone] Fax: [phone]     Social Drivers of Health (SDOH) Social History: SDOH Screenings   Food Insecurity: No Food Insecurity (12/05/2023)  Housing: Low Risk  (12/05/2023)  Transportation Needs: No Transportation Needs (12/05/2023)  Utilities: Not At Risk (12/05/2023)  Tobacco Use: High Risk (12/06/2023)   SDOH Interventions:     Readmission Risk Interventions     No data to display

## 2023-12-06 NOTE — Consult Note (Signed)
 Referring Provider: Dr. Mikle Alexanders Primary Care Physician:  Trellis Fries, MD Primary Gastroenterologist:  Para Bold  Reason for Consultation:  Esophagitis; Heartburn; Odynophagia  HPI: Samuel LOISEAU Sr. is a 53 y.o. male with one week of constant burning in his epigastric area that is worse with swallowing. Symptoms started day after eating an oxtail that felt like it was not going down well. Denies vomiting while eating it or after. Denies sensation of food hanging up during this week. Pain is intense and has not eaten anything all week. Has lost weight but does not know how much. Usually weighs 220 pounds and based on weight on admit he has lost 10 pounds. Denies black stools. Denies NSAIDs. Occasional alcohol. Reports EGD in December at [facility] (records not available) and reports it only showed H. Pylori. CT showed concern for esophagitis.  Past Medical History:  Diagnosis Date   Abnormal resting ECG findings    LVH with repolarization abnormalities.   Benign essential HTN    Has a history of medical nonadherence   LVH (left ventricular hypertrophy)    borderline    Past Surgical History:  Procedure Laterality Date   TRANSTHORACIC ECHOCARDIOGRAM  10/2016   Normal LV size with vigorous motion.  EF 65-70%.  No dynamic obstruction, but hyperdynamic LV.  Increased flow velocities intracavity.  This is likely the cause of the murmur.  No evidence of valvular or subvalvular disease.  GR 1 DD    Prior to Admission medications   Medication Sig Start Date End Date Taking? Authorizing Provider  atorvastatin  (LIPITOR) 40 MG tablet Take 1 tablet (40 mg total) by mouth daily. 04/02/23  Yes Wittenborn, Bernardo Bridgeman, NP  diltiazem  (CARDIZEM  CD) 120 MG 24 hr capsule Take 1 capsule (120 mg total) by mouth daily. 04/02/23 12/05/23 Yes Wittenborn, Deborah, NP  ibuprofen (ADVIL) 800 MG tablet Take 800 mg by mouth 3 (three) times daily. 12/01/23  Yes [provider]  JARDIANCE  25 MG TABS tablet Take 25 mg  by mouth daily. 04/02/23  Yes [provider]  LANTUS  SOLOSTAR 100 UNIT/ML Solostar Pen Inject 10 Units into the skin at bedtime. 10/07/22  Yes [provider]  multivitamin (ONE-A-DAY MEN'S) TABS Take 1 tablet by mouth daily.   Yes [provider]  ondansetron  (ZOFRAN ) 8 MG tablet Take 8 mg by mouth every 8 (eight) hours as needed. 12/01/23  Yes [provider]  RYBELSUS  14 MG TABS Take 1 tablet by mouth daily. 03/22/23  Yes [provider]  spironolactone  (ALDACTONE ) 25 MG tablet Take 1 tablet (25 mg total) by mouth daily. Schedule an appointment for further refills, second attempt 04/02/23  Yes Wittenborn, Bernardo Bridgeman, NP  tadalafil (CIALIS) 5 MG tablet Take 5 mg by mouth daily. 11/19/23  Yes [provider]  tamsulosin  (FLOMAX ) 0.4 MG CAPS capsule Take 0.4 mg by mouth at bedtime. 03/05/23  Yes [provider]  valsartan  (DIOVAN ) 320 MG tablet Take 1 tablet (320 mg total) by mouth daily. 04/02/23  Yes Wittenborn, Deborah, NP  Vitamin D, Ergocalciferol, (DRISDOL) 1.25 MG (50000 UNIT) CAPS capsule Take 50,000 Units by mouth once a week. On Sundays 06/10/20  Yes [provider]    Scheduled Meds:  atorvastatin   40 mg Oral Daily   diltiazem   120 mg Oral Daily   empagliflozin   25 mg Oral Daily   enoxaparin  (LOVENOX ) injection  40 mg Subcutaneous Q24H   insulin  aspart  0-9 Units Subcutaneous TID WC   insulin  glargine-yfgn  5 Units Subcutaneous QHS  pantoprazole  (PROTONIX ) IV  40 mg Intravenous Q0600   spironolactone   25 mg Oral Daily   tamsulosin   0.4 mg Oral QHS   Continuous Infusions: PRN Meds:.acetaminophen  **OR** acetaminophen , alum & mag hydroxide-simeth, nitroGLYCERIN , ondansetron  **OR** ondansetron  (ZOFRAN ) IV  Allergies as of 12/05/2023 - Review Complete 12/05/2023  Allergen Reaction Noted   Metformin and related  12/05/2023    Family History  Problem Relation Age of Onset   Hypertension Mother        2 Maternal uncles  also HTN & DM   Diabetes Mellitus II Mother    Hypertension Maternal Grandmother    Diabetes Mellitus II Maternal Grandmother     Social History   Socioeconomic History   Marital status: Single    Spouse name: Not on file   Number of children: Not on file   Years of education: Not on file   Highest education level: Not on file  Occupational History   Not on file  Tobacco Use   Smoking status: Light Smoker    Types: Cigars   Smokeless tobacco: Never   Tobacco comments:    once month; maybe one cigar; cessation counseling not needed  Substance and Sexual Activity   Alcohol use: No   Drug use: No   Sexual activity: Not on file  Other Topics Concern   Not on file  Social History Narrative   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 He is extremely fit and healthy doing routine weightlifting and cardiovascular exercises -- working out 5 days a week   He is currently single with 2 children -- his schedule is not amenable to relationship with the children's mother.   He does a knowledge of having occasional glass of wine socially. He also will have occasional me one cigar a month with his friends, but nothing significant.   Social Drivers of Corporate investment banker Strain: Not on file  Food Insecurity: No Food Insecurity (12/05/2023)   Hunger Vital Sign    Worried About Running Out of Food in the Last Year: Never true    Ran Out of Food in the Last Year: Never true  Transportation Needs: No Transportation Needs (12/05/2023)   PRAPARE - Administrator, Civil Service (Medical): No    Lack of Transportation (Non-Medical): No  Physical Activity: Not on file  Stress: Not on file  Social Connections: Not on file  Intimate Partner Violence: Not At Risk (12/05/2023)   Humiliation, Afraid, Rape, and Kick questionnaire    Fear of Current or Ex-Partner: No    Emotionally Abused: No    Physically Abused: No    Sexually Abused: No    Review  of Systems: All negative except as stated above in HPI.  Physical Exam: Vital signs: Vitals:   12/05/23 2208 12/06/23 0506  BP: (!) 146/89 119/74  Pulse: 81 (!) 108  Resp: 16 18  Temp: 98.4 F (36.9 C) 99 F (37.2 C)  SpO2: 98% 99%   Last BM Date : 11/28/23 General: Lethargic, well-nourished, no acute distress, pleasant Head: normocephalic, atraumatic Eyes: anicteric sclera ENT: oropharynx clear Neck: supple, nontender Lungs:  Clear throughout to auscultation.   No wheezes, crackles, or rhonchi. No acute distress. Heart:  Regular rate and rhythm; no murmurs, clicks, rubs,  or gallops. Abdomen: epigastric tenderness with minimal guarding, soft, nondistended, +BS  Rectal:  Deferred Ext: no edema  GI:  Lab Results: Recent Labs  12/05/23 1011 12/06/23 0439  WBC 9.8 8.8  HGB 18.9* 17.4*  HCT 54.9* 50.5  PLT 233 215   BMET Recent Labs    12/05/23 1011 12/06/23 0439  NA 135 135  K 3.6 3.8  CL 103 104  CO2 24 22  GLUCOSE 146* 89  BUN 26* 26*  CREATININE 1.17 1.26*  CALCIUM  9.0 8.4*   LFT Recent Labs    12/05/23 1011  PROT 6.6  ALBUMIN  3.6  AST 19  ALT 24  ALKPHOS 66  BILITOT 1.5*   PT/INR No results for input(s): "LABPROT", "INR" in the last 72 hours.   Studies/Results:  Impression/Plan: Odynophagia, heartburn, and epigastric pain the day after eating an oxtail that may have hung up and caused esophagitis. EGD today to further evaluate.    LOS: 0 days   [name]  12/06/2023, 9:50 AM  Questions please call [phone]

## 2023-12-06 NOTE — Anesthesia Preprocedure Evaluation (Addendum)
 Anesthesia Evaluation  Patient identified by MRN, date of birth, ID band Patient awake    Reviewed: Allergy & Precautions, H&P , NPO status , Patient's Chart, lab work & pertinent test results  Airway Mallampati: II   Neck ROM: full    Dental   Pulmonary Current Smoker and Patient abstained from smoking.   breath sounds clear to auscultation       Cardiovascular hypertension, Pt. on medications  Rhythm:regular Rate:Normal  LVH.  Elevated troponin levels this admission.  Cardiology feels this is not ACS.  TTE 2025 1. Technically difficult; severe LVH (most prominent in the septum) with  SAM; LVOT gradient possibly as high as 7.5 m/s with valsalva; findings c/w  HOCM; suggest cardiac MRI to further assess.   2. Left ventricular ejection fraction, by estimation, is >75%. The left  ventricle has hyperdynamic function. The left ventricle has no regional  wall motion abnormalities. There is severe concentric left ventricular  hypertrophy. Indeterminate diastolic  filling due to E-A fusion.   3. Right ventricular systolic function is normal. The right ventricular  size is normal.   4. The mitral valve is grossly normal. Mild mitral valve regurgitation.  No evidence of mitral stenosis.   5. The aortic valve was not well visualized. Aortic valve regurgitation  is not visualized. No aortic stenosis is present.     Neuro/Psych    GI/Hepatic   Endo/Other  diabetes, Type 2, Insulin  Dependent    Renal/GU      Musculoskeletal   Abdominal   Peds  Hematology   Anesthesia Other Findings   Reproductive/Obstetrics                             Anesthesia Physical Anesthesia Plan  ASA: 3  Anesthesia Plan: MAC   Post-op Pain Management:    Induction: Intravenous  PONV Risk Score and Plan: 0 and Propofol  infusion and Treatment may vary due to age or medical condition  Airway Management Planned:  Nasal Cannula  Additional Equipment:   Intra-op Plan:   Post-operative Plan:   Informed Consent: I have reviewed the patients History and Physical, chart, labs and discussed the procedure including the risks, benefits and alternatives for the proposed anesthesia with the patient or authorized representative who has indicated his/her understanding and acceptance.     Dental advisory given  Plan Discussed with: CRNA, Anesthesiologist and Surgeon  Anesthesia Plan Comments:        Anesthesia Quick Evaluation

## 2023-12-06 NOTE — Consult Note (Addendum)
 Cardiology Consultation   Patient ID: Samuel BLETHEN Sr. MRN: [id number]; DOB: [birth year]  Admit date: 12/05/2023 Date of Consult: 12/06/2023  PCP:  Samuel Fries, MD   [facility] Providers Cardiologist:  Samuel Bustard, MD   {  Patient Profile:   Samuel KLINGBERG Sr. is a 53 y.o. male with a hx of hypertension, hyperlipidemia, diabetes, nicotine dependence, BPH, CKD who is being seen 12/06/2023 for the evaluation of elevated troponins at the request of Dr. Consuela Denier.  History of Present Illness:   Samuel Gutierrez is followed by cardiology due to previous complaints of burning chest pain and palpitations.  He had a coronary CT ordered that showed a CAC score of 2.14 and no evidence of obstructive CAD.  Heart monitor also with occasional PVCs with 1.3% burden.  Also has known history of LVH for which a cardiac MRI was ordered but look to have ever been completed.  He has no family history of MIs.  Diabetes is decently controlled.  Still smokes about 3 to 4 cigarettes a day.  Denies any drugs or alcohol.  Currently patient being evaluated for burning epigastric pain, CT showing evidence of esophagitis and small hiatal hernia.  Plans were to see GI but due to worsening pain he came in to ER to get evaluated.  He had elevated troponins 73-81-98-90.  So cardiology was consulted.  Patient reports persistent/constant burning chest pain worse after eating something and when laying flat.  He has no exertional symptoms of chest pain, works as a International aid/development worker for the [employer] and never has any chest pain with activity.  Denies any associated symptoms such as radiating pain, fatigue, shortness of breath.  No swelling, decreased exercise tolerance, passing out.  His palpitations occur when he has flareups of his epigastric pain.  Chest x-ray negative.  CTA showing no acute findings.  He had right solid pulmonary nodule 3 mm noted.  Potassium 2.8.  Creatinine 1.26.  Hemoglobin 17.4, RBC 5.86.  A1c  6.7%.  BNP 157.   Past Medical History:  Diagnosis Date   Abnormal resting ECG findings    LVH with repolarization abnormalities.   Benign essential HTN    Has a history of medical nonadherence   LVH (left ventricular hypertrophy)    borderline    Past Surgical History:  Procedure Laterality Date   TRANSTHORACIC ECHOCARDIOGRAM  10/2016   Normal LV size with vigorous motion.  EF 65-70%.  No dynamic obstruction, but hyperdynamic LV.  Increased flow velocities intracavity.  This is likely the cause of the murmur.  No evidence of valvular or subvalvular disease.  GR 1 DD     Inpatient Medications: Scheduled Meds:  atorvastatin   40 mg Oral Daily   diltiazem   120 mg Oral Daily   empagliflozin   25 mg Oral Daily   enoxaparin  (LOVENOX ) injection  40 mg Subcutaneous Q24H   insulin  aspart  0-9 Units Subcutaneous TID WC   insulin  glargine-yfgn  5 Units Subcutaneous QHS   pantoprazole  (PROTONIX ) IV  40 mg Intravenous Q0600   spironolactone   25 mg Oral Daily   tamsulosin   0.4 mg Oral QHS   Continuous Infusions:  PRN Meds: acetaminophen  **OR** acetaminophen , alum & mag hydroxide-simeth, nitroGLYCERIN , ondansetron  **OR** ondansetron  (ZOFRAN ) IV  Allergies:    Allergies  Allergen Reactions   Metformin And Related     Gastric upset    Social History:   Social History   Socioeconomic History   Marital status: Single    Spouse  name: Not on file   Number of children: Not on file   Years of education: Not on file   Highest education level: Not on file  Occupational History   Not on file  Tobacco Use   Smoking status: Light Smoker    Types: Cigars   Smokeless tobacco: Never   Tobacco comments:    once month; maybe one cigar; cessation counseling not needed  Substance and Sexual Activity   Alcohol use: No   Drug use: No   Sexual activity: Not on file  Other Topics Concern   Not on file  Social History Narrative   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 He is extremely fit and healthy doing routine weightlifting and cardiovascular exercises -- working out 5 days a week   He is currently single with 2 children -- his schedule is not amenable to relationship with the children's mother.   He does a knowledge of having occasional glass of wine socially. He also will have occasional me one cigar a month with his friends, but nothing significant.   Social Drivers of Corporate investment banker Strain: Not on file  Food Insecurity: No Food Insecurity (12/05/2023)   Hunger Vital Sign    Worried About Running Out of Food in the Last Year: Never true    Ran Out of Food in the Last Year: Never true  Transportation Needs: No Transportation Needs (12/05/2023)   PRAPARE - Administrator, Civil Service (Medical): No    Lack of Transportation (Non-Medical): No  Physical Activity: Not on file  Stress: Not on file  Social Connections: Not on file  Intimate Partner Violence: Not At Risk (12/05/2023)   Humiliation, Afraid, Rape, and Kick questionnaire    Fear of Current or Ex-Partner: No    Emotionally Abused: No    Physically Abused: No    Sexually Abused: No    Family History:   Family History  Problem Relation Age of Onset   Hypertension Mother        2 Maternal uncles also HTN & DM   Diabetes Mellitus II Mother    Hypertension Maternal Grandmother    Diabetes Mellitus II Maternal Grandmother      ROS:  Please see the history of present illness.  All other ROS reviewed and negative.     Physical Exam/Data:   Vitals:   12/05/23 1603 12/05/23 1802 12/05/23 2208 12/06/23 0506  BP:  135/87 (!) 146/89 119/74  Pulse:  92 81 (!) 108  Resp:  16 16 18   Temp: 98.4 F (36.9 C) 98 F (36.7 C) 98.4 F (36.9 C) 99 F (37.2 C)  TempSrc: Oral Oral Oral   SpO2:  99% 98% 99%  Weight:      Height:  6' (1.829 m)      Intake/Output Summary (Last 24 hours) at 12/06/2023 0755 Last data filed at 12/06/2023 8295 Gross per 24 hour   Intake 1929.88 ml  Output 650 ml  Net 1279.88 ml      12/05/2023    8:32 AM 04/02/2023    9:18 AM 12/11/2022    8:18 AM  Last 3 Weights  Weight (lbs) 211 lb 226 lb 216 lb  Weight (kg) 95.709 kg 102.513 kg 97.977 kg     Body mass index is 28.62 kg/m.  General:  Well nourished, well developed, in no acute distress HEENT: normal Neck: no JVD Vascular: No carotid bruits;  Distal pulses 2+ bilaterally Cardiac:  normal S1, S2; 3/6 murmur at apex  Lungs:  clear to auscultation bilaterally, no wheezing, rhonchi or rales  Abd: soft, nontender, no hepatomegaly  Ext: no edema Musculoskeletal:  No deformities, BUE and BLE strength normal and equal Skin: warm and dry  Neuro:  CNs 2-12 intact, no focal abnormalities noted Psych:  Normal affect   EKG:  The EKG was personally reviewed and demonstrates: EKG showing sinus tachycardia, heart rate 116.  Lateral T wave inversions.. Telemetry:  Telemetry was personally reviewed and demonstrates: Sinus tachycardia with heart rates between 100-120.  Relevant CV Studies: Coronary CTA  07/19/2023 1. Coronary artery calcium  score 2.14 Agatston units. This places the patient in the 71st percentile for age and gender, suggesting intermediate risk for future cardiac events.   2.  No obstructive coronary disease was noted.  Heart monitor 11/19/2022  14-day Zio patch monitor: April 2024   Predominant Underlying Rhythm Is Sinus: Heart rate range 57 to 169 bpm with an average 88 bpm.   Occasional PVCs (1.3%) with couplets and triplets.  Also short spells of bigeminy and trigeminy. Symptoms noted with sinus rhythm and PVCs.   Rare isolated PACs with rare couplets and triplets.  (< 1%)   7 Ventricular Runs (Nonsustained Ventricular Tachycardia-NSVT): Fastest was 4 beats (1.4 seconds) at a rate of 245 bpm .  Longest was 7 beats (4.2 seconds) t with an average rate of 102 bpm.  3 consecutive runs within 30 seconds.   6 Atrial Runs (4-6 beats): Fastest was 4 beats  (1.4 seconds) with a max rate of 218 bpm, longest was 6 beats (2.6 seconds) with an average of 152 bpm.   No Sustained Arrhythmias: Atrial Tachycardia (AT), Supraventricular Tachycardia (SVT), Atrial Fibrillation (A-Fib), Atrial Flutter (A-Flutter), Sustained Ventricular Tachycardia (VT)   No Prolonged Pauses or significant bradycardia.  No AV block.   Somewhat interesting monitor results.  For the most part no sustained rhythms is relatively benign, but there were ventricular runs and occasional (more than usual) PVCs (premature ventricular contractions).     The PVCs are noted on symptoms, but not the other arrhythmia issues.  Nothing was sustained so relatively benign.  We just need to work on resting heart rate and early heartbeats.   Need to find out what was happening at the time of the fast heart rate spells.  Will need to discuss at follow-up  Laboratory Data:  High Sensitivity Troponin:   Recent Labs  Lab 12/05/23 1011 12/05/23 1205 12/05/23 1553 12/05/23 1811  TROPONINIHS 73* 81* 98* 90*     Chemistry Recent Labs  Lab 12/05/23 1011 12/06/23 0439  NA 135 135  K 3.6 3.8  CL 103 104  CO2 24 22  GLUCOSE 146* 89  BUN 26* 26*  CREATININE 1.17 1.26*  CALCIUM  9.0 8.4*  GFRNONAA >60 >60  ANIONGAP 8 9    Recent Labs  Lab 12/05/23 1011  PROT 6.6  ALBUMIN  3.6  AST 19  ALT 24  ALKPHOS 66  BILITOT 1.5*   Lipids No results for input(s): "CHOL", "TRIG", "HDL", "LABVLDL", "LDLCALC", "CHOLHDL" in the last 168 hours.  Hematology Recent Labs  Lab 12/05/23 1011 12/06/23 0439  WBC 9.8 8.8  RBC 6.46* 5.86*  HGB 18.9* 17.4*  HCT 54.9* 50.5  MCV 85.0 86.2  MCH 29.3 29.7  MCHC 34.4 34.5  RDW 11.9 11.8  PLT 233 215   Thyroid No results for input(s): "TSH", "FREET4" in the last 168 hours.  BNP Recent Labs  Lab 12/05/23 1011  BNP 157.5*    DDimer No results for input(s): "DDIMER" in the last 168 hours.   Radiology/Studies:  CT Angio Chest/Abd/Pel for Dissection  W and/or W/WO Result Date: 12/05/2023 CLINICAL DATA:  Aortic aneurysm suspected EXAM: CT ANGIOGRAPHY CHEST, ABDOMEN AND PELVIS TECHNIQUE: Non-contrast CT of the chest was initially obtained. Multidetector CT imaging through the chest, abdomen and pelvis was performed using the standard protocol during bolus administration of intravenous contrast. Multiplanar reconstructed images and MIPs were obtained and reviewed to evaluate the vascular anatomy. RADIATION DOSE REDUCTION: This exam was performed according to the departmental dose-optimization program which includes automated exposure control, adjustment of the mA and/or kV according to patient size and/or use of iterative reconstruction technique. CONTRAST:  OMNIPAQUE  IOHEXOL  350 MG/ML SOLN COMPARISON:  August 24, 2020., July 19, 2023 FINDINGS: CTA CHEST FINDINGS Cardiovascular: Preferential opacification of the thoracic aorta. No evidence of thoracic aortic aneurysm, intramural hematoma or dissection. Limited evaluation of the aortic root secondary to motion. Normal heart size. Trace pericardial fluid. Mediastinum/Nodes: Visualized thyroid is unremarkable. No axillary mediastinal adenopathy. No radiopaque foreign body visualized within the sternum. Punctate calcification along the course of the esophagus, unchanged and likely a calcified hilar lymph node. Lungs/Pleura: No pleural effusion or pneumothorax. Focal area of emphysematous versus posttraumatic/post inflammatory cystic changes in the LEFT lower lobe. Scattered calcified pulmonary nodules. 3 mm RIGHT middle lobe pulmonary nodule (series 6, image 91). Musculoskeletal: No chest wall abnormality. No acute or significant osseous findings. Review of the MIP images confirms the above findings. CTA ABDOMEN AND PELVIS FINDINGS VASCULAR Aorta: Normal caliber aorta without aneurysm, dissection, vasculitis or significant stenosis. Celiac: Patent. There is mild narrowing of the origin with poststenotic  dilation which could reflect underlying median arcuate ligament syndrome in the appropriate clinical setting. SMA: Patent without evidence of aneurysm, dissection, vasculitis or significant stenosis. Renals: Both renal arteries are patent without evidence of aneurysm, dissection, vasculitis, fibromuscular dysplasia or significant stenosis. IMA: Patent without evidence of aneurysm, dissection, vasculitis or significant stenosis. Inflow: Patent without evidence of aneurysm, dissection, vasculitis or significant stenosis. Veins: No obvious venous abnormality within the limitations of this arterial phase study. Review of the MIP images confirms the above findings. NON-VASCULAR Hepatobiliary: Focal fatty deposition along the falciform ligament. Gallbladder is unremarkable. Pancreas: Unremarkable. No pancreatic ductal dilatation or surrounding inflammatory changes. Spleen: Normal in size without focal abnormality. Adrenals/Urinary Tract: Similar fullness of the adrenal glands. Kidneys enhance symmetrically. No hydronephrosis. No obstructing nephrolithiasis. Bladder is decompressed and unremarkable. Stomach/Bowel: No evidence of bowel obstruction. Appendix is normal. Mild circumferential wall prominence of the distal esophagus with a favored tiny hiatal hernia. Stomach is decompressed. Lymphatic: No suspicious lymphadenopathy. Reproductive: Prostate is unremarkable. Other: No free air or free fluid. Musculoskeletal: No acute or significant osseous findings. Review of the MIP images confirms the above findings. IMPRESSION: 1. No evidence of aortic aneurysm, intramural hematoma or dissection. 2. No radiopaque foreign body visualized within the mediastinum. 3. There is mild circumferential wall prominence of the distal esophagus with a favored tiny hiatal hernia. This could reflect esophagitis. 4. Right solid pulmonary nodule measuring 3 mm. Per Fleischner Society Guidelines, no routine follow-up imaging is recommended. These  guidelines do not apply to immunocompromised patients and patients with cancer. Follow up in patients with significant comorbidities as clinically warranted. For lung cancer screening, adhere to Lung-RADS guidelines. Reference: Radiology. 2017; 284(1):228-43. Electronically Signed   By: Clancy Crimes M.D.   On: 12/05/2023  13:51   DG Chest Port 1 View Result Date: 12/05/2023 CLINICAL DATA:  Shortness of breath.  Hypertension. EXAM: PORTABLE CHEST 1 VIEW COMPARISON:  None Available. FINDINGS: The heart size and mediastinal contours are within normal limits. Both lungs are clear. The visualized skeletal structures are unremarkable. IMPRESSION: No active disease. Electronically Signed   By: Marlyce Sine M.D.   On: 12/05/2023 10:31     Assessment and Plan:   Elevated troponins Epigastric pain Symptoms very clearly seem related to GI, he has nonexertional resting epigastric burning pain with no radiating or exertional symptoms.  EKG overall nonischemic, T wave inversions laterally likely related to LVH but actually improved from prior readings.  Troponins flat with no real trend 73-81-98-90.  Also has reassuring recent coronary CTA that showed CAC score of 2.14, no evidence of obstructive CAD. Continue to work up gastritis, GI consult.  Palpitations Heart monitor May 2024 showing no significant arrhythmias, PVCs, 1.3%.  These symptoms are directly correlated to his GI related pains. On diltiazem  120 mg daily  LVH Noted on previous echocardiogram October 2022 with hyperdynamic LV function with severe LVH prominent in the septum and apex with chordal SAM, no LVOT noted at that time.  Plans initially were for cardiac MRI, this has not been done, we can continue to follow this outpatient and arrange.  He has appreciable murmur but no signs or symptoms of syncope.   Echocardiogram pending here  Nicotine dependence Smokes 3 to 4 cigarettes.  Encouraged cessation.  Sinus tachycardia Likely due to  pain from gastritis as above  Hyperlipidemia On atorvastatin  40 mg  Diabetes Reasonably controlled 6.7%.  Will arrange follow-up.   Risk Assessment/Risk Scores:     For questions or updates, please contact [facility] Please consult [email or web address] for contact info under    Signed, Burnetta Cart, PA-C  12/06/2023 7:55 AM   Patient seen and examined.  Agree with above documentation.  Mr. Bruso is a 53 year old male with a history of hypertension, hyperlipidemia, diabetes, CKD we are consulted by Dr Consuela Denier for evaluation of troponin elevation.  He has had previous workup for chest pain, underwent coronary CT 07/2023 which showed nonobstructive CAD, calcium  score 2.  He presents this admission with epigastric pain.  GI consulted, concerning for esophagitis, planning EGD today.  EKG shows sinus tachycardia, rate 116, diffuse less than 1 mm ST depressions, unchanged from prior EKG.  Troponin mildly elevated but flat (73 > 81 > 98 > 90).  On exam, patient is alert and oriented, regular rhythm, tachycardic, no murmurs, lungs CTAB, no LE edema or JVD.  Suspect troponin elevation represents demand ischemia in setting of his acute illness.  Recent coronary CTA showed nonobstructive CAD.  Will follow-up echocardiogram.  Prior echo showed significant LVH, recommend outpatient CMR for further evaluation.  Wendie Hamburg, MD

## 2023-12-06 NOTE — Interval H&P Note (Signed)
 History and Physical Interval Note:  12/06/2023 2:59 PM  Samuel Bali Sr.  has presented today for surgery, with the diagnosis of Odynophagia; Heartburn; Epigastric pain.  The various methods of treatment have been discussed with the patient and family. After consideration of risks, benefits and other options for treatment, the patient has consented to  Procedure(s): EGD (ESOPHAGOGASTRODUODENOSCOPY) (N/A) as a surgical intervention.  The patient's history has been reviewed, patient examined, no change in status, stable for surgery.  I have reviewed the patient's chart and labs.  Questions were answered to the patient's satisfaction.     [name]

## 2023-12-06 NOTE — H&P (View-Only) (Signed)
 Referring Provider: Dr. Mikle Alexanders Primary Care Physician:  Trellis Fries, MD Primary Gastroenterologist:  Para Bold  Reason for Consultation:  Esophagitis; Heartburn; Odynophagia  HPI: Samuel LOISEAU Sr. is a 53 y.o. male with one week of constant burning in his epigastric area that is worse with swallowing. Symptoms started day after eating an oxtail that felt like it was not going down well. Denies vomiting while eating it or after. Denies sensation of food hanging up during this week. Pain is intense and has not eaten anything all week. Has lost weight but does not know how much. Usually weighs 220 pounds and based on weight on admit he has lost 10 pounds. Denies black stools. Denies NSAIDs. Occasional alcohol. Reports EGD in December at [facility] (records not available) and reports it only showed H. Pylori. CT showed concern for esophagitis.  Past Medical History:  Diagnosis Date   Abnormal resting ECG findings    LVH with repolarization abnormalities.   Benign essential HTN    Has a history of medical nonadherence   LVH (left ventricular hypertrophy)    borderline    Past Surgical History:  Procedure Laterality Date   TRANSTHORACIC ECHOCARDIOGRAM  10/2016   Normal LV size with vigorous motion.  EF 65-70%.  No dynamic obstruction, but hyperdynamic LV.  Increased flow velocities intracavity.  This is likely the cause of the murmur.  No evidence of valvular or subvalvular disease.  GR 1 DD    Prior to Admission medications   Medication Sig Start Date End Date Taking? Authorizing Provider  atorvastatin  (LIPITOR) 40 MG tablet Take 1 tablet (40 mg total) by mouth daily. 04/02/23  Yes Wittenborn, Bernardo Bridgeman, NP  diltiazem  (CARDIZEM  CD) 120 MG 24 hr capsule Take 1 capsule (120 mg total) by mouth daily. 04/02/23 12/05/23 Yes Wittenborn, Deborah, NP  ibuprofen (ADVIL) 800 MG tablet Take 800 mg by mouth 3 (three) times daily. 12/01/23  Yes [provider]  JARDIANCE  25 MG TABS tablet Take 25 mg  by mouth daily. 04/02/23  Yes [provider]  LANTUS  SOLOSTAR 100 UNIT/ML Solostar Pen Inject 10 Units into the skin at bedtime. 10/07/22  Yes [provider]  multivitamin (ONE-A-DAY MEN'S) TABS Take 1 tablet by mouth daily.   Yes [provider]  ondansetron  (ZOFRAN ) 8 MG tablet Take 8 mg by mouth every 8 (eight) hours as needed. 12/01/23  Yes [provider]  RYBELSUS  14 MG TABS Take 1 tablet by mouth daily. 03/22/23  Yes [provider]  spironolactone  (ALDACTONE ) 25 MG tablet Take 1 tablet (25 mg total) by mouth daily. Schedule an appointment for further refills, second attempt 04/02/23  Yes Wittenborn, Bernardo Bridgeman, NP  tadalafil (CIALIS) 5 MG tablet Take 5 mg by mouth daily. 11/19/23  Yes [provider]  tamsulosin  (FLOMAX ) 0.4 MG CAPS capsule Take 0.4 mg by mouth at bedtime. 03/05/23  Yes [provider]  valsartan  (DIOVAN ) 320 MG tablet Take 1 tablet (320 mg total) by mouth daily. 04/02/23  Yes Wittenborn, Deborah, NP  Vitamin D, Ergocalciferol, (DRISDOL) 1.25 MG (50000 UNIT) CAPS capsule Take 50,000 Units by mouth once a week. On Sundays 06/10/20  Yes [provider]    Scheduled Meds:  atorvastatin   40 mg Oral Daily   diltiazem   120 mg Oral Daily   empagliflozin   25 mg Oral Daily   enoxaparin  (LOVENOX ) injection  40 mg Subcutaneous Q24H   insulin  aspart  0-9 Units Subcutaneous TID WC   insulin  glargine-yfgn  5 Units Subcutaneous QHS  pantoprazole  (PROTONIX ) IV  40 mg Intravenous Q0600   spironolactone   25 mg Oral Daily   tamsulosin   0.4 mg Oral QHS   Continuous Infusions: PRN Meds:.acetaminophen  **OR** acetaminophen , alum & mag hydroxide-simeth, nitroGLYCERIN , ondansetron  **OR** ondansetron  (ZOFRAN ) IV  Allergies as of 12/05/2023 - Review Complete 12/05/2023  Allergen Reaction Noted   Metformin and related  12/05/2023    Family History  Problem Relation Age of Onset   Hypertension Mother        2 Maternal uncles  also HTN & DM   Diabetes Mellitus II Mother    Hypertension Maternal Grandmother    Diabetes Mellitus II Maternal Grandmother     Social History   Socioeconomic History   Marital status: Single    Spouse name: Not on file   Number of children: Not on file   Years of education: Not on file   Highest education level: Not on file  Occupational History   Not on file  Tobacco Use   Smoking status: Light Smoker    Types: Cigars   Smokeless tobacco: Never   Tobacco comments:    once month; maybe one cigar; cessation counseling not needed  Substance and Sexual Activity   Alcohol use: No   Drug use: No   Sexual activity: Not on file  Other Topics Concern   Not on file  Social History Narrative   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 He is extremely fit and healthy doing routine weightlifting and cardiovascular exercises -- working out 5 days a week   He is currently single with 2 children -- his schedule is not amenable to relationship with the children's mother.   He does a knowledge of having occasional glass of wine socially. He also will have occasional me one cigar a month with his friends, but nothing significant.   Social Drivers of Corporate investment banker Strain: Not on file  Food Insecurity: No Food Insecurity (12/05/2023)   Hunger Vital Sign    Worried About Running Out of Food in the Last Year: Never true    Ran Out of Food in the Last Year: Never true  Transportation Needs: No Transportation Needs (12/05/2023)   PRAPARE - Administrator, Civil Service (Medical): No    Lack of Transportation (Non-Medical): No  Physical Activity: Not on file  Stress: Not on file  Social Connections: Not on file  Intimate Partner Violence: Not At Risk (12/05/2023)   Humiliation, Afraid, Rape, and Kick questionnaire    Fear of Current or Ex-Partner: No    Emotionally Abused: No    Physically Abused: No    Sexually Abused: No    Review  of Systems: All negative except as stated above in HPI.  Physical Exam: Vital signs: Vitals:   12/05/23 2208 12/06/23 0506  BP: (!) 146/89 119/74  Pulse: 81 (!) 108  Resp: 16 18  Temp: 98.4 F (36.9 C) 99 F (37.2 C)  SpO2: 98% 99%   Last BM Date : 11/28/23 General: Lethargic, well-nourished, no acute distress, pleasant Head: normocephalic, atraumatic Eyes: anicteric sclera ENT: oropharynx clear Neck: supple, nontender Lungs:  Clear throughout to auscultation.   No wheezes, crackles, or rhonchi. No acute distress. Heart:  Regular rate and rhythm; no murmurs, clicks, rubs,  or gallops. Abdomen: epigastric tenderness with minimal guarding, soft, nondistended, +BS  Rectal:  Deferred Ext: no edema  GI:  Lab Results: Recent Labs  12/05/23 1011 12/06/23 0439  WBC 9.8 8.8  HGB 18.9* 17.4*  HCT 54.9* 50.5  PLT 233 215   BMET Recent Labs    12/05/23 1011 12/06/23 0439  NA 135 135  K 3.6 3.8  CL 103 104  CO2 24 22  GLUCOSE 146* 89  BUN 26* 26*  CREATININE 1.17 1.26*  CALCIUM  9.0 8.4*   LFT Recent Labs    12/05/23 1011  PROT 6.6  ALBUMIN  3.6  AST 19  ALT 24  ALKPHOS 66  BILITOT 1.5*   PT/INR No results for input(s): "LABPROT", "INR" in the last 72 hours.   Studies/Results:  Impression/Plan: Odynophagia, heartburn, and epigastric pain the day after eating an oxtail that may have hung up and caused esophagitis. EGD today to further evaluate.    LOS: 0 days   [name]  12/06/2023, 9:50 AM  Questions please call [phone]

## 2023-12-06 NOTE — Plan of Care (Signed)
  Problem: Education: Goal: Ability to describe self-care measures that may prevent or decrease complications (Diabetes Survival Skills Education) will improve Outcome: Progressing   Problem: Coping: Goal: Ability to adjust to condition or change in health will improve Outcome: Progressing   Problem: Coping: Goal: Level of anxiety will decrease Outcome: Progressing

## 2023-12-06 NOTE — Progress Notes (Signed)
 PROGRESS NOTE    [name]  UJW:[id number] DOB: Oct 22, 1970 DOA: 12/05/2023 PCP: Samuel Fries, MD    Brief Narrative:  Samuel Bali Sr. is a 53 y.o. male with medical history significant of HTN, T2DM, HLD, CKD stage II, BPH presenting with burning chest discomfort.    Assessment and Plan: Esophagitis Tiny hiatal hernia Patient presenting with 1 week history of burning chest discomfort with associated nausea and mild generalized abdominal pain.  Lipase normal.  CT imaging negative for aortic aneurysm or dissection.  CT imaging does reveal esophagitis and a tiny hiatal hernia, which are likely the cause of patient's burning chest discomfort.  His pain has resolved with IV Pepcid , Maalox, and lidocaine  mouthwash.  - GI consulted- plan for EGD - IV Protonix  daily    Elevated troponin Patient with uptrending troponins (73 > 81 > 98).  Discussed with cardiology, advised that this is likely demand ischemia secondary to GI etiology.  Low concern for ACS.  EKG without any acute ischemic changes.  Will obtain echocardiogram and cardiology will see tomorrow morning.  Per cardiology, no indication for heparin therapy at this time. - Continue trending troponins until peak - Cardiology consulted, appreciate assistance - Follow-up echocardiogram   Hypertension Blood pressures ranging in the 110s-130s/80s-90s.  Patient is on diltiazem  120 mg daily, spironolactone  25 mg daily, valsartan  320 mg daily at home.  -resume meds as able   Type 2 diabetes with hyperglycemia -HgbA1c: 6.7 - Semglee  5 units nightly for tonight given he will be n.p.o. at midnight - Sensitive SSI - Trend CBGs, goal 140-180   CKD stage II Baseline creatinine around 1.2.  On admission, creatinine 1.17. - Chronic and stable   Hyperlipidemia - Resume home Lipitor   BPH - Resume home Flomax    3mm R solid pulmonary nodule -noted on CTA imaging, no routine follow up imaging recommended   DVT prophylaxis:  enoxaparin  (LOVENOX ) injection 40 mg Start: 12/05/23 2200    Code Status: Full Code   Disposition Plan:  Level of care: Telemetry     Consultants:  GI cards   Subjective: Still with pain/burning  Objective: Vitals:   12/05/23 1603 12/05/23 1802 12/05/23 2208 12/06/23 0506  BP:  135/87 (!) 146/89 119/74  Pulse:  92 81 (!) 108  Resp:  16 16 18   Temp: 98.4 F (36.9 C) 98 F (36.7 C) 98.4 F (36.9 C) 99 F (37.2 C)  TempSrc: Oral Oral Oral   SpO2:  99% 98% 99%  Weight:      Height:  6' (1.829 m)      Intake/Output Summary (Last 24 hours) at 12/06/2023 1056 Last data filed at 12/06/2023 5621 Gross per 24 hour  Intake 1929.88 ml  Output 800 ml  Net 1129.88 ml   Filed Weights   12/05/23 0832  Weight: 95.7 kg    Examination:   General: Appearance:     Overweight male in no acute distress     Lungs:      respirations unlabored  Heart:    Tachycardic.   MS:   All extremities are intact.    Neurologic:   Awake, alert       Data Reviewed: I have personally reviewed following labs and imaging studies  CBC: Recent Labs  Lab 12/05/23 1011 12/06/23 0439  WBC 9.8 8.8  NEUTROABS 5.6  --   HGB 18.9* 17.4*  HCT 54.9* 50.5  MCV 85.0 86.2  PLT 233 215   Basic Metabolic Panel: Recent  Labs  Lab 12/05/23 1011 12/06/23 0439  NA 135 135  K 3.6 3.8  CL 103 104  CO2 24 22  GLUCOSE 146* 89  BUN 26* 26*  CREATININE 1.17 1.26*  CALCIUM  9.0 8.4*   GFR: Estimated Creatinine Clearance: 82.3 mL/min (A) (by C-G formula based on SCr of 1.26 mg/dL (H)). Liver Function Tests: Recent Labs  Lab 12/05/23 1011  AST 19  ALT 24  ALKPHOS 66  BILITOT 1.5*  PROT 6.6  ALBUMIN  3.6   Recent Labs  Lab 12/05/23 1011  LIPASE 41   No results for input(s): "AMMONIA" in the last 168 hours. Coagulation Profile: No results for input(s): "INR", "PROTIME" in the last 168 hours. Cardiac Enzymes: No results for input(s): "CKTOTAL", "CKMB", "CKMBINDEX", "TROPONINI" in  the last 168 hours. BNP (last 3 results) No results for input(s): "PROBNP" in the last 8760 hours. HbA1C: Recent Labs    12/05/23 1811  HGBA1C 6.7*   CBG: Recent Labs  Lab 12/05/23 1757 12/05/23 1947 12/06/23 0750 12/06/23 1046  GLUCAP 157* 148* 79 85   Lipid Profile: No results for input(s): "CHOL", "HDL", "LDLCALC", "TRIG", "CHOLHDL", "LDLDIRECT" in the last 72 hours. Thyroid Function Tests: No results for input(s): "TSH", "T4TOTAL", "FREET4", "T3FREE", "THYROIDAB" in the last 72 hours. Anemia Panel: No results for input(s): "VITAMINB12", "FOLATE", "FERRITIN", "TIBC", "IRON", "RETICCTPCT" in the last 72 hours. Sepsis Labs: No results for input(s): "PROCALCITON", "LATICACIDVEN" in the last 168 hours.  No results found for this or any previous visit (from the past 240 hours).       Radiology Studies: CT Angio Chest/Abd/Pel for Dissection W and/or W/WO Result Date: 12/05/2023 CLINICAL DATA:  Aortic aneurysm suspected EXAM: CT ANGIOGRAPHY CHEST, ABDOMEN AND PELVIS TECHNIQUE: Non-contrast CT of the chest was initially obtained. Multidetector CT imaging through the chest, abdomen and pelvis was performed using the standard protocol during bolus administration of intravenous contrast. Multiplanar reconstructed images and MIPs were obtained and reviewed to evaluate the vascular anatomy. RADIATION DOSE REDUCTION: This exam was performed according to the departmental dose-optimization program which includes automated exposure control, adjustment of the mA and/or kV according to patient size and/or use of iterative reconstruction technique. CONTRAST:  OMNIPAQUE  IOHEXOL  350 MG/ML SOLN COMPARISON:  August 24, 2020., July 19, 2023 FINDINGS: CTA CHEST FINDINGS Cardiovascular: Preferential opacification of the thoracic aorta. No evidence of thoracic aortic aneurysm, intramural hematoma or dissection. Limited evaluation of the aortic root secondary to motion. Normal heart size. Trace  pericardial fluid. Mediastinum/Nodes: Visualized thyroid is unremarkable. No axillary mediastinal adenopathy. No radiopaque foreign body visualized within the sternum. Punctate calcification along the course of the esophagus, unchanged and likely a calcified hilar lymph node. Lungs/Pleura: No pleural effusion or pneumothorax. Focal area of emphysematous versus posttraumatic/post inflammatory cystic changes in the LEFT lower lobe. Scattered calcified pulmonary nodules. 3 mm RIGHT middle lobe pulmonary nodule (series 6, image 91). Musculoskeletal: No chest wall abnormality. No acute or significant osseous findings. Review of the MIP images confirms the above findings. CTA ABDOMEN AND PELVIS FINDINGS VASCULAR Aorta: Normal caliber aorta without aneurysm, dissection, vasculitis or significant stenosis. Celiac: Patent. There is mild narrowing of the origin with poststenotic dilation which could reflect underlying median arcuate ligament syndrome in the appropriate clinical setting. SMA: Patent without evidence of aneurysm, dissection, vasculitis or significant stenosis. Renals: Both renal arteries are patent without evidence of aneurysm, dissection, vasculitis, fibromuscular dysplasia or significant stenosis. IMA: Patent without evidence of aneurysm, dissection, vasculitis or significant stenosis. Inflow: Patent without evidence  of aneurysm, dissection, vasculitis or significant stenosis. Veins: No obvious venous abnormality within the limitations of this arterial phase study. Review of the MIP images confirms the above findings. NON-VASCULAR Hepatobiliary: Focal fatty deposition along the falciform ligament. Gallbladder is unremarkable. Pancreas: Unremarkable. No pancreatic ductal dilatation or surrounding inflammatory changes. Spleen: Normal in size without focal abnormality. Adrenals/Urinary Tract: Similar fullness of the adrenal glands. Kidneys enhance symmetrically. No hydronephrosis. No obstructing nephrolithiasis.  Bladder is decompressed and unremarkable. Stomach/Bowel: No evidence of bowel obstruction. Appendix is normal. Mild circumferential wall prominence of the distal esophagus with a favored tiny hiatal hernia. Stomach is decompressed. Lymphatic: No suspicious lymphadenopathy. Reproductive: Prostate is unremarkable. Other: No free air or free fluid. Musculoskeletal: No acute or significant osseous findings. Review of the MIP images confirms the above findings. IMPRESSION: 1. No evidence of aortic aneurysm, intramural hematoma or dissection. 2. No radiopaque foreign body visualized within the mediastinum. 3. There is mild circumferential wall prominence of the distal esophagus with a favored tiny hiatal hernia. This could reflect esophagitis. 4. Right solid pulmonary nodule measuring 3 mm. Per Fleischner Society Guidelines, no routine follow-up imaging is recommended. These guidelines do not apply to immunocompromised patients and patients with cancer. Follow up in patients with significant comorbidities as clinically warranted. For lung cancer screening, adhere to Lung-RADS guidelines. Reference: Radiology. 2017; 284(1):228-43. Electronically Signed   By: Clancy Crimes M.D.   On: 12/05/2023 13:51   DG Chest Port 1 View Result Date: 12/05/2023 CLINICAL DATA:  Shortness of breath.  Hypertension. EXAM: PORTABLE CHEST 1 VIEW COMPARISON:  None Available. FINDINGS: The heart size and mediastinal contours are within normal limits. Both lungs are clear. The visualized skeletal structures are unremarkable. IMPRESSION: No active disease. Electronically Signed   By: Marlyce Sine M.D.   On: 12/05/2023 10:31        Scheduled Meds:  atorvastatin   40 mg Oral Daily   diltiazem   120 mg Oral Daily   empagliflozin   25 mg Oral Daily   enoxaparin  (LOVENOX ) injection  40 mg Subcutaneous Q24H   insulin  aspart  0-9 Units Subcutaneous TID WC   insulin  glargine-yfgn  5 Units Subcutaneous QHS   pantoprazole  (PROTONIX ) IV  40  mg Intravenous Q0600   spironolactone   25 mg Oral Daily   tamsulosin   0.4 mg Oral QHS   Continuous Infusions:   LOS: 0 days    Time spent: 45 minutes spent on chart review, discussion with nursing staff, consultants, updating family and interview/physical exam; more than 50% of that time was spent in counseling and/or coordination of care.    Enrigue Harvard, DO [facility] Available via Epic secure chat 7am-7pm After these hours, please refer to coverage provider listed on [email or web address] 12/06/2023, 10:56 AM

## 2023-12-06 NOTE — Op Note (Signed)
 [facility] Patient Name: Samuel Gutierrez Procedure Date: 12/06/2023 MRN: [id number] Attending MD: [name] , MD, [id number] Date of Birth: 1971-02-06 CSN: [id number] Age: 53 Admit Type: Inpatient Procedure:                Upper GI endoscopy Indications:              Epigastric abdominal pain, Heartburn, Abnormal CT                            of the GI tract Providers:                [name], MD, Lorenzo Romberg, RN, Judith Novak, Technician Referring MD:             hospital team Medicines:                Propofol  per Anesthesia, Monitored Anesthesia Care Complications:            No immediate complications. Estimated Blood Loss:     Estimated blood loss was minimal. Procedure:                Pre-Anesthesia Assessment:                           - Prior to the procedure, a History and Physical                            was performed, and patient medications and                            allergies were reviewed. The patient's tolerance of                            previous anesthesia was also reviewed. The risks                            and benefits of the procedure and the sedation                            options and risks were discussed with the patient.                            All questions were answered, and informed consent                            was obtained. Prior Anticoagulants: The patient has                            taken no anticoagulant or antiplatelet agents. ASA                            Grade Assessment: III - A patient with severe  systemic disease. After reviewing the risks and                            benefits, the patient was deemed in satisfactory                            condition to undergo the procedure.                           After obtaining informed consent, the endoscope was                            passed under direct vision. Throughout the                             procedure, the patient's blood pressure, pulse, and                            oxygen saturations were monitored continuously. The                            [id number]-[id number] ([id number] Olympus endoscope was introduced                            through the mouth, and advanced to the second part                            of duodenum. The [id number]-[id number] ([id number]) Olympus slim                            endoscope was introduced through the and advanced                            to the. The upper GI endoscopy was accomplished                            without difficulty. The patient tolerated the                            procedure well. Scope In: Scope Out: Findings:      LA Grade D (one or more mucosal breaks involving at least 75% of       esophageal circumference) esophagitis with bleeding was found in the       distal esophagus. Biopsies were taken with a cold forceps for histology.       Estimated blood loss was minimal.      The Z-line was found 42 cm from the incisors.      A benign-appearing, intrinsic severe stenosis was found at the pylorus.       This was traversed after downsizing to a pediatric endoscope.      Segmental moderate inflammation characterized by congestion (edema) and       erythema was found in the gastric antrum and in the prepyloric region of       the stomach. Biopsies were taken with a cold forceps for histology.  Estimated blood loss was minimal.      The examined duodenum was normal. Impression:               - LA Grade D reflux esophagitis with bleeding.                            Biopsied.                           - Z-line, 42 cm from the incisors.                           - Gastric stenosis was found at the pylorus.                           - Gastritis. Biopsied.                           - Normal examined duodenum. Moderate Sedation:      N/A - MAC procedure Recommendation:           - NPO.                           - Use  sucralfate  suspension 1 gram PO QID.                           - Await pathology results.                           - Observe patient's clinical course. Procedure Code(s):        --- Professional ---                           [phone], Esophagogastroduodenoscopy, flexible,                            transoral; with biopsy, single or multiple Diagnosis Code(s):        --- Professional ---                           R10.13, Epigastric pain                           R12, Heartburn                           K29.70, Gastritis, unspecified, without bleeding                           K21.01, Gastro-esophageal reflux disease with                            esophagitis, with bleeding                           K31.1, Adult hypertrophic pyloric stenosis  R93.3, Abnormal findings on diagnostic imaging of                            other parts of digestive tract CPT copyright 2022 American Medical Association. All rights reserved. The codes documented in this report are preliminary and upon coder review may  be revised to meet current compliance requirements. [name], MD 12/06/2023 3:38:18 PM This report has been signed electronically. Number of Addenda: 0

## 2023-12-07 ENCOUNTER — Other Ambulatory Visit (HOSPITAL_COMMUNITY): Payer: Self-pay

## 2023-12-07 DIAGNOSIS — K311 Adult hypertrophic pyloric stenosis: Secondary | ICD-10-CM | POA: Diagnosis present

## 2023-12-07 DIAGNOSIS — E663 Overweight: Secondary | ICD-10-CM | POA: Diagnosis present

## 2023-12-07 DIAGNOSIS — Z833 Family history of diabetes mellitus: Secondary | ICD-10-CM | POA: Diagnosis not present

## 2023-12-07 DIAGNOSIS — F17290 Nicotine dependence, other tobacco product, uncomplicated: Secondary | ICD-10-CM | POA: Diagnosis present

## 2023-12-07 DIAGNOSIS — I517 Cardiomegaly: Secondary | ICD-10-CM | POA: Diagnosis present

## 2023-12-07 DIAGNOSIS — R000 Tachycardia, unspecified: Secondary | ICD-10-CM | POA: Diagnosis not present

## 2023-12-07 DIAGNOSIS — Z79899 Other long term (current) drug therapy: Secondary | ICD-10-CM | POA: Diagnosis not present

## 2023-12-07 DIAGNOSIS — Z794 Long term (current) use of insulin: Secondary | ICD-10-CM | POA: Diagnosis not present

## 2023-12-07 DIAGNOSIS — E785 Hyperlipidemia, unspecified: Secondary | ICD-10-CM | POA: Diagnosis present

## 2023-12-07 DIAGNOSIS — Z6828 Body mass index (BMI) 28.0-28.9, adult: Secondary | ICD-10-CM | POA: Diagnosis not present

## 2023-12-07 DIAGNOSIS — Z888 Allergy status to other drugs, medicaments and biological substances status: Secondary | ICD-10-CM | POA: Diagnosis not present

## 2023-12-07 DIAGNOSIS — E1165 Type 2 diabetes mellitus with hyperglycemia: Secondary | ICD-10-CM | POA: Diagnosis present

## 2023-12-07 DIAGNOSIS — K2101 Gastro-esophageal reflux disease with esophagitis, with bleeding: Secondary | ICD-10-CM | POA: Diagnosis present

## 2023-12-07 DIAGNOSIS — I493 Ventricular premature depolarization: Secondary | ICD-10-CM | POA: Diagnosis present

## 2023-12-07 DIAGNOSIS — Z7984 Long term (current) use of oral hypoglycemic drugs: Secondary | ICD-10-CM | POA: Diagnosis not present

## 2023-12-07 DIAGNOSIS — I129 Hypertensive chronic kidney disease with stage 1 through stage 4 chronic kidney disease, or unspecified chronic kidney disease: Secondary | ICD-10-CM | POA: Diagnosis present

## 2023-12-07 DIAGNOSIS — K209 Esophagitis, unspecified without bleeding: Secondary | ICD-10-CM | POA: Diagnosis not present

## 2023-12-07 DIAGNOSIS — N182 Chronic kidney disease, stage 2 (mild): Secondary | ICD-10-CM | POA: Diagnosis present

## 2023-12-07 DIAGNOSIS — R911 Solitary pulmonary nodule: Secondary | ICD-10-CM | POA: Diagnosis present

## 2023-12-07 DIAGNOSIS — Z8249 Family history of ischemic heart disease and other diseases of the circulatory system: Secondary | ICD-10-CM | POA: Diagnosis not present

## 2023-12-07 DIAGNOSIS — E1122 Type 2 diabetes mellitus with diabetic chronic kidney disease: Secondary | ICD-10-CM | POA: Diagnosis present

## 2023-12-07 DIAGNOSIS — R7989 Other specified abnormal findings of blood chemistry: Secondary | ICD-10-CM | POA: Diagnosis present

## 2023-12-07 DIAGNOSIS — N400 Enlarged prostate without lower urinary tract symptoms: Secondary | ICD-10-CM | POA: Diagnosis present

## 2023-12-07 DIAGNOSIS — F17210 Nicotine dependence, cigarettes, uncomplicated: Secondary | ICD-10-CM | POA: Diagnosis present

## 2023-12-07 LAB — CBC
HCT: 49.5 % (ref 39.0–52.0)
Hemoglobin: 16.8 g/dL (ref 13.0–17.0)
MCH: 29.6 pg (ref 26.0–34.0)
MCHC: 33.9 g/dL (ref 30.0–36.0)
MCV: 87.1 fL (ref 80.0–100.0)
Platelets: 197 10*3/uL (ref 150–400)
RBC: 5.68 MIL/uL (ref 4.22–5.81)
RDW: 11.9 % (ref 11.5–15.5)
WBC: 7.5 10*3/uL (ref 4.0–10.5)
nRBC: 0 % (ref 0.0–0.2)

## 2023-12-07 LAB — BASIC METABOLIC PANEL WITH GFR
Anion gap: 7 (ref 5–15)
BUN: 22 mg/dL — ABNORMAL HIGH (ref 6–20)
CO2: 22 mmol/L (ref 22–32)
Calcium: 8.4 mg/dL — ABNORMAL LOW (ref 8.9–10.3)
Chloride: 105 mmol/L (ref 98–111)
Creatinine, Ser: 1.19 mg/dL (ref 0.61–1.24)
GFR, Estimated: >60 mL/min (ref >60)
Glucose, Bld: 106 mg/dL — ABNORMAL HIGH (ref 70–99)
Potassium: 3.9 mmol/L (ref 3.5–5.1)
Sodium: 134 mmol/L — ABNORMAL LOW (ref 135–145)

## 2023-12-07 LAB — SURGICAL PATHOLOGY

## 2023-12-07 LAB — GLUCOSE, CAPILLARY
Glucose-Capillary: 97 mg/dL (ref 70–99)
Glucose-Capillary: 132 mg/dL — ABNORMAL HIGH (ref 70–99)

## 2023-12-07 MED ORDER — PANTOPRAZOLE SODIUM 40 MG PO TBEC
40.0000 mg | DELAYED_RELEASE_TABLET | Freq: Two times a day (BID) | ORAL | 0 refills | Status: AC
  Filled 2023-12-07: qty 180, 90d supply, fill #0

## 2023-12-07 MED ORDER — SODIUM CHLORIDE 0.9 % IV SOLN: INTRAVENOUS | Status: DC

## 2023-12-07 MED ORDER — SUCRALFATE 1 GM/10ML PO SUSP
1.0000 g | Freq: Three times a day (TID) | ORAL | 0 refills | Status: DC
  Filled 2023-12-07: qty 1242, 31d supply, fill #0

## 2023-12-07 NOTE — TOC Transition Note (Signed)
 Transition of Care [facility]) - Discharge Note   Patient Details  Name: Samuel SMALLMAN Sr. MRN: [id number] Date of Birth: 1970-12-11  [facility]) CM/SW Contact:  Ruben Corolla, RN Phone Number: 12/07/2023, 3:55 PM   Clinical Narrative: d/c home. No CM needs.      Final next level of care: Home/Self Care Barriers to Discharge: No Barriers Identified   Patient Goals and CMS Choice Patient states their goals for this hospitalization and ongoing recovery are:: Home CMS Medicare.gov Compare Post Acute Care list provided to:: Patient Choice offered to / list presented to : Patient [place] ownership interest in [facility].provided to:: Patient    Discharge Placement                       Discharge Plan and Services Additional resources added to the After Visit Summary for                                       Social Drivers of Health (SDOH) Interventions SDOH Screenings   Food Insecurity: No Food Insecurity (12/05/2023)  Housing: Low Risk  (12/05/2023)  Transportation Needs: No Transportation Needs (12/05/2023)  Utilities: Not At Risk (12/05/2023)  Tobacco Use: High Risk (12/06/2023)     Readmission Risk Interventions     No data to display

## 2023-12-07 NOTE — H&P (Deleted)
 Physician Discharge Summary  ROHN FRITSCH Sr. NGE:[id number] DOB: 23-Jan-1971 DOA: 12/05/2023  PCP: Trellis Fries, MD  Admit date: 12/05/2023 Discharge date: 12/07/2023  Admitted From: Home Discharge disposition: Home   Recommendations for Outpatient Follow-Up:   Hold Aldactone  until follow-up with cardiology PPI 40 mg PO BID for 3 months and then every day. Carafate  QID for 4 weeks. F/U with me in 4-6 weeks    Discharge Diagnosis:   Principal Problem:   Esophagitis Active Problems:   Elevated troponin   Sinus tachycardia   Epigastric pain   LVH (left ventricular hypertrophy)    Discharge Condition: Improved.  Diet recommendation: Diabetic  Wound care: None.  Code status: Full.   History of Present Illness:   Samuel DAVID Sr. is a 53 y.o. male with medical history significant of HTN, T2DM, HLD, CKD stage II, BPH presenting with burning chest discomfort.   Patient reports that he was in his usual state of health until last Saturday.  He ordered ox tails for dinner and after eating 1 he began feeling unwell.  States that the texture and taste of the ox tail was different than what he was expecting.  He ultimately went to sleep and the next morning after eating breakfast he began having a burning chest discomfort that persisted throughout the day.  Sunday night, he began noticing hiccups that were fairly frequent.  The following day on Monday, he went to see his primary care doctor and was prescribed a medication (unclear which medication) and he took that for a couple of days without any alleviation of his burning discomfort.  He again saw his primary care doctor on Wednesday and was prescribed ibuprofen which also did not help with his burning discomfort.  On Friday, he was referred to GI by his primary care doctor and was scheduled to be seen this following week for potential endoscopy on Tuesday.  He was advised by GI to come into the ED for further evaluation if  his burning discomfort did not improve.  Given lack of improvement, he presented today for further evaluation.  He does endorse nausea but has not experienced any vomiting. He also endorses mild generalized stomach pain. He denies any fevers, vomiting, palpitations, shortness of breath, diarrhea, constipation, urinary changes.   He does endorse decreased oral intake over the past week due to his symptoms.  Has not been eating or drinking much during this time.   ED course: Vital signs stable.  Initially notable for tachycardia but this has since resolved.  CBC with elevated hemoglobin at 18.9, likely hemoconcentration.  CMP with glucose 146, creatinine 1.17 (around his baseline).  Lipase normal.  BNP mildly elevated at 157.  Troponin 73 > 81 > 98.  EKG with sinus tachycardia, no obvious acute ischemic changes noted.  ED provider consulted cardiology, who recommended observation stay and will see tomorrow morning.  Discussed with cardiology who believe that this is likely more so due to a GI etiology.  Patient given Maalox, IV Pepcid , Toradol , lidocaine  mouth solution, Zofran , IV fluid bolus by ED provider.  [facility] hospitalist asked to evaluate patient for admission   Hospital Course by Problem:  Esophagitis Tiny hiatal hernia Patient presenting with 1 week history of burning chest discomfort with associated nausea and mild generalized abdominal pain.  Lipase normal.  CT imaging negative for aortic aneurysm or dissection.  CT imaging does reveal esophagitis and a tiny hiatal hernia, which are likely the cause of patient's  burning chest discomfort.  His pain has resolved with IV Pepcid , Maalox, and lidocaine  mouthwash.  - GI consulted-status post EGD: esophagitis with bleeding was found in the distal esophagus: Prescribed Carafate  with improvement of symptoms - Diet advanced and tolerated, patient insistent on going home   Elevated troponin. - Cardiology consulted, appreciate assistance -  echocardiogram:severe LVH (most prominent in the septum) with  SAM; LVOT gradient possibly as high as 7.5 m/s with valsalva; findings c/w  HOCM; suggest cardiac MRI to further assess.  -- Suggestive of dehydration so we will do IV fluids after speaking with cardiology-heart rate improved, patient knows since he is going home to continue to hydrate orally   Hypertension Blood pressures ranging in the 110s-130s/80s-90s.  Patient is on diltiazem  120 mg daily, spironolactone  25 mg daily, valsartan  320 mg daily at home.  - Spironolactone  held until patient fully hydrated   Type 2 diabetes with hyperglycemia -HgbA1c: 6.7 - Resume home meds   CKD stage II Baseline creatinine around 1.2.  On admission, creatinine 1.17. - Chronic and stable   Hyperlipidemia - Resume home Lipitor   BPH - Resume home Flomax    3mm R solid pulmonary nodule -noted on CTA imaging, no routine follow up imaging recommended      Medical Consultants:  GI Cardiology    Discharge Exam:   Vitals:   12/07/23 1000 12/07/23 1531  BP:  113/78  Pulse:  (!) 106  Resp: 18 17  Temp:  97.7 F (36.5 C)  SpO2:  99%   Vitals:   12/07/23 0842 12/07/23 0900 12/07/23 1000 12/07/23 1531  BP: 108/64   113/78  Pulse: (!) 116   (!) 106  Resp:  17 18 17   Temp: 98.2 F (36.8 C)   97.7 F (36.5 C)  TempSrc:    Oral  SpO2: 98%   99%  Weight:      Height:        General exam: Appears calm and comfortable.   The results of significant diagnostics from this hospitalization (including imaging, microbiology, ancillary and laboratory) are listed below for reference.     Procedures and Diagnostic Studies:   ECHOCARDIOGRAM COMPLETE Result Date: 12/06/2023    ECHOCARDIOGRAM REPORT   Patient Name:   Samuel LEGATE Sr. Date of Exam: 12/06/2023 Medical Rec #:  [id number]           Height:       72.0 in Accession #:    [id number]          Weight:       211.0 lb Date of Birth:  [birth year]            BSA:          2.180 m  Patient Age:    52 years            BP:           119/74 mmHg Patient Gender: M                   HR:           116 bpm. Exam Location:  Inpatient Procedure: 2D Echo, Cardiac Doppler, Color Doppler and Intracardiac            Opacification Agent (Both Spectral and Color Flow Doppler were            utilized during procedure). Indications:    R07.9* Chest pain, unspecified  History:  Patient has prior history of Echocardiogram examinations, most                 recent 05/16/2020. Abnormal ECG, Signs/Symptoms:Murmur; Risk                 Factors:Hypertension, Dyslipidemia and Diabetes. Chordal SAM.                 Severe LVH.  Sonographer:    Raynelle Callow RDCS Referring Phys: [id number] Margo Shells [facility]  Sonographer Comments: Technically difficult study due to poor echo windows. IMPRESSIONS  1. Technically difficult; severe LVH (most prominent in the septum) with SAM; LVOT gradient possibly as high as 7.5 m/s with valsalva; findings c/w HOCM; suggest cardiac MRI to further assess.  2. Left ventricular ejection fraction, by estimation, is >75%. The left ventricle has hyperdynamic function. The left ventricle has no regional wall motion abnormalities. There is severe concentric left ventricular hypertrophy. Indeterminate diastolic filling due to E-A fusion.  3. Right ventricular systolic function is normal. The right ventricular size is normal.  4. The mitral valve is grossly normal. Mild mitral valve regurgitation. No evidence of mitral stenosis.  5. The aortic valve was not well visualized. Aortic valve regurgitation is not visualized. No aortic stenosis is present. Conclusion(s)/Recommendation(s): Findings consistent with hypertrophic obstructive cardiomyopathy. FINDINGS  Left Ventricle: Left ventricular ejection fraction, by estimation, is >75%. The left ventricle has hyperdynamic function. The left ventricle has no regional wall motion abnormalities. Definity  contrast agent was given IV to delineate the left  ventricular endocardial borders. The left ventricular internal cavity size was small. There is severe concentric left ventricular hypertrophy. Indeterminate diastolic filling due to E-A fusion. Right Ventricle: The right ventricular size is normal. Right ventricular systolic function is normal. Left Atrium: Left atrial size was normal in size. Right Atrium: Right atrial size was normal in size. Pericardium: There is no evidence of pericardial effusion. Mitral Valve: The mitral valve is grossly normal. Mild mitral valve regurgitation. No evidence of mitral valve stenosis. Tricuspid Valve: The tricuspid valve is normal in structure. Tricuspid valve regurgitation is not demonstrated. No evidence of tricuspid stenosis. Aortic Valve: The aortic valve was not well visualized. Aortic valve regurgitation is not visualized. No aortic stenosis is present. Pulmonic Valve: The pulmonic valve was not well visualized. Pulmonic valve regurgitation is not visualized. No evidence of pulmonic stenosis. Aorta: The aortic root and ascending aorta are structurally normal, with no evidence of dilitation. IAS/Shunts: The interatrial septum was not well visualized. Additional Comments: Technically difficult; severe LVH (most prominent in the septum) with SAM; LVOT gradient possibly as high as 7.5 m/s with valsalva; findings c/w HOCM; suggest cardiac MRI to further assess.  LEFT VENTRICLE PLAX 2D LVIDd:         3.65 cm     Diastology LVIDs:         2.60 cm     LV e' medial:    6.64 cm/s LV PW:         1.60 cm     LV E/e' medial:  16.6 LV IVS:        1.50 cm     LV e' lateral:   2.07 cm/s LVOT diam:     2.10 cm     LV E/e' lateral: 53.1 LV SV:         56 LV SV Index:   26 LVOT Area:     3.46 cm  LV Volumes (MOD) LV vol d, MOD A2C: 37.6 ml  LV vol d, MOD A4C: 39.3 ml LV vol s, MOD A2C: 5.2 ml LV vol s, MOD A4C: 9.0 ml LV SV MOD A2C:     32.4 ml LV SV MOD A4C:     39.3 ml LV SV MOD BP:      32.0 ml RIGHT VENTRICLE             IVC RV S prime:      14.30 cm/s  IVC diam: 1.00 cm TAPSE (M-mode): 1.6 cm LEFT ATRIUM           Index        RIGHT ATRIUM          Index LA diam:      3.00 cm 1.38 cm/m   RA Area:     9.69 cm LA Vol (A2C): 30.5 ml 13.99 ml/m  RA Volume:   17.70 ml 8.12 ml/m  AORTIC VALVE LVOT Vmax:   747.00 cm/s LVOT Vmean:  98.600 cm/s LVOT VTI:    0.162 m  AORTA Ao Root diam: 3.10 cm Ao Asc diam:  2.90 cm MITRAL VALVE MV Area (PHT): 6.83 cm     SHUNTS MV Decel Time: 111 msec     Systemic VTI:  0.16 m MV E velocity: 110.00 cm/s  Systemic Diam: 2.10 cm Alexandria Angel MD Electronically signed by Alexandria Angel MD Signature Date/Time: 12/06/2023/1:04:26 PM    Final    CT Angio Chest/Abd/Pel for Dissection W and/or W/WO Result Date: 12/05/2023 CLINICAL DATA:  Aortic aneurysm suspected EXAM: CT ANGIOGRAPHY CHEST, ABDOMEN AND PELVIS TECHNIQUE: Non-contrast CT of the chest was initially obtained. Multidetector CT imaging through the chest, abdomen and pelvis was performed using the standard protocol during bolus administration of intravenous contrast. Multiplanar reconstructed images and MIPs were obtained and reviewed to evaluate the vascular anatomy. RADIATION DOSE REDUCTION: This exam was performed according to the departmental dose-optimization program which includes automated exposure control, adjustment of the mA and/or kV according to patient size and/or use of iterative reconstruction technique. CONTRAST:  OMNIPAQUE  IOHEXOL  350 MG/ML SOLN COMPARISON:  August 24, 2020., July 19, 2023 FINDINGS: CTA CHEST FINDINGS Cardiovascular: Preferential opacification of the thoracic aorta. No evidence of thoracic aortic aneurysm, intramural hematoma or dissection. Limited evaluation of the aortic root secondary to motion. Normal heart size. Trace pericardial fluid. Mediastinum/Nodes: Visualized thyroid is unremarkable. No axillary mediastinal adenopathy. No radiopaque foreign body visualized within the sternum. Punctate calcification along the  course of the esophagus, unchanged and likely a calcified hilar lymph node. Lungs/Pleura: No pleural effusion or pneumothorax. Focal area of emphysematous versus posttraumatic/post inflammatory cystic changes in the LEFT lower lobe. Scattered calcified pulmonary nodules. 3 mm RIGHT middle lobe pulmonary nodule (series 6, image 91). Musculoskeletal: No chest wall abnormality. No acute or significant osseous findings. Review of the MIP images confirms the above findings. CTA ABDOMEN AND PELVIS FINDINGS VASCULAR Aorta: Normal caliber aorta without aneurysm, dissection, vasculitis or significant stenosis. Celiac: Patent. There is mild narrowing of the origin with poststenotic dilation which could reflect underlying median arcuate ligament syndrome in the appropriate clinical setting. SMA: Patent without evidence of aneurysm, dissection, vasculitis or significant stenosis. Renals: Both renal arteries are patent without evidence of aneurysm, dissection, vasculitis, fibromuscular dysplasia or significant stenosis. IMA: Patent without evidence of aneurysm, dissection, vasculitis or significant stenosis. Inflow: Patent without evidence of aneurysm, dissection, vasculitis or significant stenosis. Veins: No obvious venous abnormality within the limitations of this arterial phase study. Review of the MIP images confirms the  above findings. NON-VASCULAR Hepatobiliary: Focal fatty deposition along the falciform ligament. Gallbladder is unremarkable. Pancreas: Unremarkable. No pancreatic ductal dilatation or surrounding inflammatory changes. Spleen: Normal in size without focal abnormality. Adrenals/Urinary Tract: Similar fullness of the adrenal glands. Kidneys enhance symmetrically. No hydronephrosis. No obstructing nephrolithiasis. Bladder is decompressed and unremarkable. Stomach/Bowel: No evidence of bowel obstruction. Appendix is normal. Mild circumferential wall prominence of the distal esophagus with a favored tiny hiatal  hernia. Stomach is decompressed. Lymphatic: No suspicious lymphadenopathy. Reproductive: Prostate is unremarkable. Other: No free air or free fluid. Musculoskeletal: No acute or significant osseous findings. Review of the MIP images confirms the above findings. IMPRESSION: 1. No evidence of aortic aneurysm, intramural hematoma or dissection. 2. No radiopaque foreign body visualized within the mediastinum. 3. There is mild circumferential wall prominence of the distal esophagus with a favored tiny hiatal hernia. This could reflect esophagitis. 4. Right solid pulmonary nodule measuring 3 mm. Per Fleischner Society Guidelines, no routine follow-up imaging is recommended. These guidelines do not apply to immunocompromised patients and patients with cancer. Follow up in patients with significant comorbidities as clinically warranted. For lung cancer screening, adhere to Lung-RADS guidelines. Reference: Radiology. 2017; 284(1):228-43. Electronically Signed   By: Clancy Crimes M.D.   On: 12/05/2023 13:51   DG Chest Port 1 View Result Date: 12/05/2023 CLINICAL DATA:  Shortness of breath.  Hypertension. EXAM: PORTABLE CHEST 1 VIEW COMPARISON:  None Available. FINDINGS: The heart size and mediastinal contours are within normal limits. Both lungs are clear. The visualized skeletal structures are unremarkable. IMPRESSION: No active disease. Electronically Signed   By: Marlyce Sine M.D.   On: 12/05/2023 10:31     Labs:   Basic Metabolic Panel: Recent Labs  Lab 12/05/23 1011 12/06/23 0439 12/07/23 0519  NA 135 135 134*  K 3.6 3.8 3.9  CL 103 104 105  CO2 24 22 22   GLUCOSE 146* 89 106*  BUN 26* 26* 22*  CREATININE 1.17 1.26* 1.19  CALCIUM  9.0 8.4* 8.4*   GFR Estimated Creatinine Clearance: 87.1 mL/min (by C-G formula based on SCr of 1.19 mg/dL). Liver Function Tests: Recent Labs  Lab 12/05/23 1011  AST 19  ALT 24  ALKPHOS 66  BILITOT 1.5*  PROT 6.6  ALBUMIN  3.6   Recent Labs  Lab  12/05/23 1011  LIPASE 41   No results for input(s): "AMMONIA" in the last 168 hours. Coagulation profile No results for input(s): "INR", "PROTIME" in the last 168 hours.  CBC: Recent Labs  Lab 12/05/23 1011 12/06/23 0439 12/07/23 0519  WBC 9.8 8.8 7.5  NEUTROABS 5.6  --   --   HGB 18.9* 17.4* 16.8  HCT 54.9* 50.5 49.5  MCV 85.0 86.2 87.1  PLT 233 215 197   Cardiac Enzymes: No results for input(s): "CKTOTAL", "CKMB", "CKMBINDEX", "TROPONINI" in the last 168 hours. BNP: Invalid input(s): "POCBNP" CBG: Recent Labs  Lab 12/06/23 1445 12/06/23 1711 12/06/23 2115 12/07/23 0732 12/07/23 1128  GLUCAP 74 90 95 97 132*   D-Dimer No results for input(s): "DDIMER" in the last 72 hours. Hgb A1c Recent Labs    12/05/23 1811  HGBA1C 6.7*   Lipid Profile No results for input(s): "CHOL", "HDL", "LDLCALC", "TRIG", "CHOLHDL", "LDLDIRECT" in the last 72 hours. Thyroid function studies No results for input(s): "TSH", "T4TOTAL", "T3FREE", "THYROIDAB" in the last 72 hours.  Invalid input(s): "FREET3" Anemia work up No results for input(s): "VITAMINB12", "FOLATE", "FERRITIN", "TIBC", "IRON", "RETICCTPCT" in the last 72 hours. Microbiology No results found for  this or any previous visit (from the past 240 hours).   Discharge Instructions:   Discharge Instructions     Diet Carb Modified   Complete by: As directed    Discharge instructions   Complete by: As directed    Protonix  40 mg PO BID for 3 months and then every day. Carafate  QID for 4 weeks. F/U with Dr. Honey Lusty in 4-6 weeks.   Increase activity slowly   Complete by: As directed       Allergies as of 12/07/2023       Reactions   Metformin And Related    Gastric upset        Medication List     PAUSE taking these medications    spironolactone  25 MG tablet Wait to take this until your doctor or other care provider tells you to start again. Commonly known as: ALDACTONE  Take 1 tablet (25 mg total) by  mouth daily. Schedule an appointment for further refills, second attempt       STOP taking these medications    valsartan  320 MG tablet Commonly known as: DIOVAN        TAKE these medications    atorvastatin  40 MG tablet Commonly known as: LIPITOR Take 1 tablet (40 mg total) by mouth daily.   diltiazem  120 MG 24 hr capsule Commonly known as: CARDIZEM  CD Take 1 capsule (120 mg total) by mouth daily.   ibuprofen 800 MG tablet Commonly known as: ADVIL Take 800 mg by mouth 3 (three) times daily.   Jardiance  25 MG Tabs tablet Generic drug: empagliflozin  Take 25 mg by mouth daily.   Lantus  SoloStar 100 UNIT/ML Solostar Pen Generic drug: insulin  glargine Inject 10 Units into the skin at bedtime.   multivitamin Tabs tablet Take 1 tablet by mouth daily.   ondansetron  8 MG tablet Commonly known as: ZOFRAN  Take 8 mg by mouth every 8 (eight) hours as needed.   pantoprazole  40 MG tablet Commonly known as: Protonix  Take 1 tablet (40 mg total) by mouth 2 (two) times daily.   Rybelsus  14 MG Tabs Generic drug: Semaglutide  Take 1 tablet by mouth daily.   sucralfate  1 GM/10ML suspension Commonly known as: CARAFATE  Take 10 mLs (1 g total) by mouth 4 (four) times daily -  with meals and at bedtime.   tadalafil 5 MG tablet Commonly known as: CIALIS Take 5 mg by mouth daily.   tamsulosin  0.4 MG Caps capsule Commonly known as: FLOMAX  Take 0.4 mg by mouth at bedtime.   Vitamin D (Ergocalciferol) 1.25 MG (50000 UNIT) Caps capsule Commonly known as: DRISDOL Take 50,000 Units by mouth once a week. On Sundays        Follow-up Information     Trellis Fries, MD Follow up in 1 week(s).   Specialty: Family Medicine Contact information: [address] [place] [address] [phone]                  Time coordinating discharge: 45 minutes  Signed:  Enrigue Harvard DO  [facility] 12/07/2023, 3:49 PM

## 2023-12-07 NOTE — Progress Notes (Signed)
 Patient being discharged to home today. All discharge instructions including all discharge Medications and schedules for these Medications reviewed with the Patient. Patient verbalized understanding and discharge AVS with the Patient at discharge.

## 2023-12-07 NOTE — Progress Notes (Addendum)
 Patient Name: Samuel BLESSINGER Sr. Date of Encounter: 12/07/2023 [facility] Cardiologist: Randene Bustard, MD   Interval Summary  .    53 y.o. male with a hx of hypertension, hyperlipidemia, type 2 DM, tobacco use, BPH, CKD II, cardiology is following for elevated troponin.   Patient states he feels well, denied any chest pain, SOB, syncope, palpitation. He is waiting to speak to GI. He has not been eating due to EGD yesterday, did drink fluids.   Vital Signs .    Vitals:   12/06/23 1630 12/06/23 2225 12/07/23 0145 12/07/23 0605  BP: 110/85 122/80 133/85 106/69  Pulse: (!) 113 (!) 112 (!) 116 (!) 113  Resp: 18     Temp: 98.1 F (36.7 C) 98 F (36.7 C) 98.2 F (36.8 C) 98.3 F (36.8 C)  TempSrc:  Oral Oral Oral  SpO2: 100%  96% 99%  Weight:      Height:        Intake/Output Summary (Last 24 hours) at 12/07/2023 0812 Last data filed at 12/06/2023 1538 Gross per 24 hour  Intake 800 ml  Output 150 ml  Net 650 ml      12/05/2023    8:32 AM 04/02/2023    9:18 AM 12/11/2022    8:18 AM  Last 3 Weights  Weight (lbs) 211 lb 226 lb 216 lb  Weight (kg) 95.709 kg 102.513 kg 97.977 kg      Telemetry/ECG    Sinus tachycardia 110-120s  - Personally Reviewed  Physical Exam .   GEN: No acute distress.   Neck: No JVD Cardiac: Tachycardiac, no murmurs, rubs, or gallops.  Respiratory: Clear to auscultation bilaterally. On room air  GI: Soft, nontender, non-distended  MS: No leg edema  Assessment & Plan .     Elevated troponin  - presented with chest burning discomfort and GI symptoms - Hs trop 73 >81 >98 >90 - EKG showed old TWI of lateral leads - CT coronary study from 07/19/23 showed no obstructive coronary disease, calcium  score 2  - EGD from 5/19 showed esophagitis with bleeding, gastric stenosis, and gastritis - suspect chest discomfort is from GI, demand ischemia, no evidence of ACS,  given overall negative ischemic cardiac workup now and in the past.    LVH  concerning for HOCM Sinus tachycardia  - Echo from 5/19 showed severe LVH (most prominent in the septum) with SAM; LVOT gradient possibly as high as 7.5 m/s with valsalva; findings c/w  HOCM; LVEFF >75%, no RWMA, indeterminate diastolic filling, mild MR - Hold PTA spironolactone  25mg  daily, may need some IVF for hydration if unable take PO, this may help tachycardia if dehydration playing a role - Continue PTA diltiazem  and valsartan  for HTN and palpitation  - will plan for cardiac MRI outpatient, follow up arranged on 12/30/23   HTN Type 2 DM CKD II HLD Tobacco use - per IM    For questions or updates, please contact [facility] Please consult [email or web address] for contact info under    Signed, Xika Zhao, NP   Patient seen and examined.  Agree with above documentation.  On exam, patient is alert and oriented, tachycardic, regular, no murmurs, lungs CTAB, no LE edema or JVD.  Underwent EGD yesterday which showed esophagitis and gastritis.  Echo yesterday showed severe LVH with SAM and significantly elevated gradients.  Suspect elevated gradients due to dehydration in setting of poor PO intake due to esophagitis.  Hold spironolactone .  D/w Dr Mikle Alexanders, will  give IV fluids today.  Wendie Hamburg, MD

## 2023-12-07 NOTE — Progress Notes (Signed)
 PROGRESS NOTE    Samuel CABELLO Sr.  EAV:[id number] DOB: [birth year] DOA: 12/05/2023 PCP: Trellis Fries, MD    Brief Narrative:  Samuel Bali Sr. is a 54 y.o. male with medical history significant of HTN, T2DM, HLD, CKD stage II, BPH presenting with burning chest discomfort.    Assessment and Plan: Esophagitis Tiny hiatal hernia Patient presenting with 1 week history of burning chest discomfort with associated nausea and mild generalized abdominal pain.  Lipase normal.  CT imaging negative for aortic aneurysm or dissection.  CT imaging does reveal esophagitis and a tiny hiatal hernia, which are likely the cause of patient's burning chest discomfort.  His pain has resolved with IV Pepcid , Maalox, and lidocaine  mouthwash.  - GI consulted-status post EGD: esophagitis with bleeding was found in the distal esophagus: Prescribed Carafate  with improvement of symptoms - Advance diet to full liquids for now    Elevated troponin. - Cardiology consulted, appreciate assistance - echocardiogram:severe LVH (most prominent in the septum) with  SAM; LVOT gradient possibly as high as 7.5 m/s with valsalva; findings c/w  HOCM; suggest cardiac MRI to further assess.  -- Suggestive of dehydration so we will do IV fluids after speaking with cardiology   Hypertension Blood pressures ranging in the 110s-130s/80s-90s.  Patient is on diltiazem  120 mg daily, spironolactone  25 mg daily, valsartan  320 mg daily at home.  -resume meds as able   Type 2 diabetes with hyperglycemia -HgbA1c: 6.7 - Sliding scale insulin    CKD stage II Baseline creatinine around 1.2.  On admission, creatinine 1.17. - Chronic and stable   Hyperlipidemia - Resume home Lipitor   BPH - Resume home Flomax    3mm R solid pulmonary nodule -noted on CTA imaging, no routine follow up imaging recommended   DVT prophylaxis: enoxaparin  (LOVENOX ) injection 40 mg Start: 12/05/23 2200    Code Status: Full Code   Disposition Plan:   Level of care: Telemetry     Consultants:  GI cards   Subjective: Wanting to eat more food  Objective: Vitals:   12/07/23 0800 12/07/23 0842 12/07/23 0900 12/07/23 1000  BP:  108/64    Pulse:  (!) 116    Resp: (!) 25  17 18   Temp:  98.2 F (36.8 C)    TempSrc:      SpO2:  98%    Weight:      Height:        Intake/Output Summary (Last 24 hours) at 12/07/2023 1349 Last data filed at 12/07/2023 1200 Gross per 24 hour  Intake 920 ml  Output 0 ml  Net 920 ml   Filed Weights   12/05/23 0832  Weight: 95.7 kg    Examination:   General: Appearance:     Overweight male in no acute distress     Lungs:      respirations unlabored  Heart:    Tachycardic.   MS:   All extremities are intact.    Neurologic:   Awake, alert       Data Reviewed: I have personally reviewed following labs and imaging studies  CBC: Recent Labs  Lab 12/05/23 1011 12/06/23 0439 12/07/23 0519  WBC 9.8 8.8 7.5  NEUTROABS 5.6  --   --   HGB 18.9* 17.4* 16.8  HCT 54.9* 50.5 49.5  MCV 85.0 86.2 87.1  PLT 233 215 197   Basic Metabolic Panel: Recent Labs  Lab 12/05/23 1011 12/06/23 0439 12/07/23 0519  NA 135 135 134*  K 3.6 3.8 3.9  CL 103 104 105  CO2 24 22 22   GLUCOSE 146* 89 106*  BUN 26* 26* 22*  CREATININE 1.17 1.26* 1.19  CALCIUM  9.0 8.4* 8.4*   GFR: Estimated Creatinine Clearance: 87.1 mL/min (by C-G formula based on SCr of 1.19 mg/dL). Liver Function Tests: Recent Labs  Lab 12/05/23 1011  AST 19  ALT 24  ALKPHOS 66  BILITOT 1.5*  PROT 6.6  ALBUMIN  3.6   Recent Labs  Lab 12/05/23 1011  LIPASE 41   No results for input(s): "AMMONIA" in the last 168 hours. Coagulation Profile: No results for input(s): "INR", "PROTIME" in the last 168 hours. Cardiac Enzymes: No results for input(s): "CKTOTAL", "CKMB", "CKMBINDEX", "TROPONINI" in the last 168 hours. BNP (last 3 results) No results for input(s): "PROBNP" in the last 8760 hours. HbA1C: Recent Labs     12/05/23 1811  HGBA1C 6.7*   CBG: Recent Labs  Lab 12/06/23 1445 12/06/23 1711 12/06/23 2115 12/07/23 0732 12/07/23 1128  GLUCAP 74 90 95 97 132*   Lipid Profile: No results for input(s): "CHOL", "HDL", "LDLCALC", "TRIG", "CHOLHDL", "LDLDIRECT" in the last 72 hours. Thyroid Function Tests: No results for input(s): "TSH", "T4TOTAL", "FREET4", "T3FREE", "THYROIDAB" in the last 72 hours. Anemia Panel: No results for input(s): "VITAMINB12", "FOLATE", "FERRITIN", "TIBC", "IRON", "RETICCTPCT" in the last 72 hours. Sepsis Labs: No results for input(s): "PROCALCITON", "LATICACIDVEN" in the last 168 hours.  No results found for this or any previous visit (from the past 240 hours).       Radiology Studies: ECHOCARDIOGRAM COMPLETE Result Date: 12/06/2023    ECHOCARDIOGRAM REPORT   Patient Name:   Samuel ENRIQUE Sr. Date of Exam: 12/06/2023 Medical Rec #:  [id number]           Height:       72.0 in Accession #:    [id number]          Weight:       211.0 lb Date of Birth:  1970/11/08            BSA:          2.180 m Patient Age:    52 years            BP:           119/74 mmHg Patient Gender: M                   HR:           116 bpm. Exam Location:  Inpatient Procedure: 2D Echo, Cardiac Doppler, Color Doppler and Intracardiac            Opacification Agent (Both Spectral and Color Flow Doppler were            utilized during procedure). Indications:    R07.9* Chest pain, unspecified  History:        Patient has prior history of Echocardiogram examinations, most                 recent 05/16/2020. Abnormal ECG, Signs/Symptoms:Murmur; Risk                 Factors:Hypertension, Dyslipidemia and Diabetes. Chordal SAM.                 Severe LVH.  Sonographer:    Raynelle Callow RDCS Referring Phys: [id number] [facility]o Shell[facility]  Sonographer Comments: Technically difficult study due to poor echo windows. IMPRESSIONS  1. Technically difficult; severe LVH (most prominent in the septum) with  SAM; LVOT gradient possibly  as high as 7.5 m/s with valsalva; findings c/w HOCM; suggest cardiac MRI to further assess.  2. Left ventricular ejection fraction, by estimation, is >75%. The left ventricle has hyperdynamic function. The left ventricle has no regional wall motion abnormalities. There is severe concentric left ventricular hypertrophy. Indeterminate diastolic filling due to E-A fusion.  3. Right ventricular systolic function is normal. The right ventricular size is normal.  4. The mitral valve is grossly normal. Mild mitral valve regurgitation. No evidence of mitral stenosis.  5. The aortic valve was not well visualized. Aortic valve regurgitation is not visualized. No aortic stenosis is present. Conclusion(s)/Recommendation(s): Findings consistent with hypertrophic obstructive cardiomyopathy. FINDINGS  Left Ventricle: Left ventricular ejection fraction, by estimation, is >75%. The left ventricle has hyperdynamic function. The left ventricle has no regional wall motion abnormalities. Definity  contrast agent was given IV to delineate the left ventricular endocardial borders. The left ventricular internal cavity size was small. There is severe concentric left ventricular hypertrophy. Indeterminate diastolic filling due to E-A fusion. Right Ventricle: The right ventricular size is normal. Right ventricular systolic function is normal. Left Atrium: Left atrial size was normal in size. Right Atrium: Right atrial size was normal in size. Pericardium: There is no evidence of pericardial effusion. Mitral Valve: The mitral valve is grossly normal. Mild mitral valve regurgitation. No evidence of mitral valve stenosis. Tricuspid Valve: The tricuspid valve is normal in structure. Tricuspid valve regurgitation is not demonstrated. No evidence of tricuspid stenosis. Aortic Valve: The aortic valve was not well visualized. Aortic valve regurgitation is not visualized. No aortic stenosis is present. Pulmonic Valve: The pulmonic valve was not well  visualized. Pulmonic valve regurgitation is not visualized. No evidence of pulmonic stenosis. Aorta: The aortic root and ascending aorta are structurally normal, with no evidence of dilitation. IAS/Shunts: The interatrial septum was not well visualized. Additional Comments: Technically difficult; severe LVH (most prominent in the septum) with SAM; LVOT gradient possibly as high as 7.5 m/s with valsalva; findings c/w HOCM; suggest cardiac MRI to further assess.  LEFT VENTRICLE PLAX 2D LVIDd:         3.65 cm     Diastology LVIDs:         2.60 cm     LV e' medial:    6.64 cm/s LV PW:         1.60 cm     LV E/e' medial:  16.6 LV IVS:        1.50 cm     LV e' lateral:   2.07 cm/s LVOT diam:     2.10 cm     LV E/e' lateral: 53.1 LV SV:         56 LV SV Index:   26 LVOT Area:     3.46 cm  LV Volumes (MOD) LV vol d, MOD A2C: 37.6 ml LV vol d, MOD A4C: 39.3 ml LV vol s, MOD A2C: 5.2 ml LV vol s, MOD A4C: 9.0 ml LV SV MOD A2C:     32.4 ml LV SV MOD A4C:     39.3 ml LV SV MOD BP:      32.0 ml RIGHT VENTRICLE             IVC RV S prime:     14.30 cm/s  IVC diam: 1.00 cm TAPSE (M-mode): 1.6 cm LEFT ATRIUM           Index        RIGHT ATRIUM  Index LA diam:      3.00 cm 1.38 cm/m   RA Area:     9.69 cm LA Vol (A2C): 30.5 ml 13.99 ml/m  RA Volume:   17.70 ml 8.12 ml/m  AORTIC VALVE LVOT Vmax:   747.00 cm/s LVOT Vmean:  98.600 cm/s LVOT VTI:    0.162 m  AORTA Ao Root diam: 3.10 cm Ao Asc diam:  2.90 cm MITRAL VALVE MV Area (PHT): 6.83 cm     SHUNTS MV Decel Time: 111 msec     Systemic VTI:  0.16 m MV E velocity: 110.00 cm/s  Systemic Diam: 2.10 cm Alexandria Angel MD Electronically signed by Alexandria Angel MD Signature Date/Time: 12/06/2023/1:04:26 PM    Final         Scheduled Meds:  atorvastatin   40 mg Oral Daily   diltiazem   120 mg Oral Daily   empagliflozin   25 mg Oral Daily   enoxaparin  (LOVENOX ) injection  40 mg Subcutaneous Q24H   insulin  aspart  0-9 Units Subcutaneous TID WC   insulin  glargine-yfgn   5 Units Subcutaneous QHS   pantoprazole  (PROTONIX ) IV  40 mg Intravenous Q12H   sucralfate   1 g Oral TID WC & HS   tamsulosin   0.4 mg Oral QHS   Continuous Infusions:  sodium chloride  100 mL/hr at 12/07/23 0923     LOS: 0 days    Time spent: 45 minutes spent on chart review, discussion with nursing staff, consultants, updating family and interview/physical exam; more than 50% of that time was spent in counseling and/or coordination of care.    Enrigue Harvard, DO [facility] Available via Epic secure chat 7am-7pm After these hours, please refer to coverage provider listed on [email or web address] 12/07/2023, 1:49 PM

## 2023-12-07 NOTE — Progress Notes (Signed)
 [facility] Progress Note  Samuel BAINES Sr. 52 y.o. Oct 28, 1970   Subjective: Hungry. Denies trouble swallowing liquids. Wants to eat a sandwich and go home today.  Objective: Vital signs: Vitals:   12/07/23 0900 12/07/23 1000  BP:    Pulse:    Resp: 17 18  Temp:    SpO2:    T 98.2, P 116, BP 108/64  Physical Exam: Gen: alert, no acute distress, well-nourished HEENT: anicteric sclera CV: RRR Chest: CTA B Abd: soft, nontender, nondistended, +BS Ext: no edema  Lab Results: Recent Labs    12/06/23 0439 12/07/23 0519  NA 135 134*  K 3.8 3.9  CL 104 105  CO2 22 22  GLUCOSE 89 106*  BUN 26* 22*  CREATININE 1.26* 1.19  CALCIUM  8.4* 8.4*   Recent Labs    12/05/23 1011  AST 19  ALT 24  ALKPHOS 66  BILITOT 1.5*  PROT 6.6  ALBUMIN  3.6   Recent Labs    12/05/23 1011 12/06/23 0439 12/07/23 0519  WBC 9.8 8.8 7.5  NEUTROABS 5.6  --   --   HGB 18.9* 17.4* 16.8  HCT 54.9* 50.5 49.5  MCV 85.0 86.2 87.1  PLT 233 215 197      Assessment/Plan: Erosive esophagitis - biopsies negative for infection. Gastritis without H. Pylori. Changed diet to soft and if tolerates ok to change to regular diet. Needs PPI 40 mg PO BID for 3 months and then every day. Carafate  QID for 4 weeks. F/U with me in 4-6 weeks. Ok to go home today from GI standpoint if tolerates soft diet.   Samuel Gutierrez 12/07/2023, 2:32 PM  Questions please call [phone] ID: Samuel Bali Sr., male   DOB: Aug 17, 1970, 53 y.o.   MRN: [id number]

## 2023-12-07 NOTE — Discharge Summary (Signed)
 Physician Discharge Summary  Samuel Gutierrez Sr. WUJ:[id number] DOB: [birth year] DOA: 12/05/2023  PCP: Trellis Fries, MD  Admit date: 12/05/2023 Discharge date: 12/07/2023  Admitted From: Home Discharge disposition: Home   Recommendations for Outpatient Follow-Up:   Hold Aldactone  until follow-up with cardiology PPI 40 mg PO BID for 3 months and then every day. Carafate  QID for 4 weeks. F/U with me in 4-6 weeks    Discharge Diagnosis:   Principal Problem:   Esophagitis Active Problems:   Elevated troponin   Sinus tachycardia   Epigastric pain   LVH (left ventricular hypertrophy)    Discharge Condition: Improved.  Diet recommendation: Diabetic  Wound care: None.  Code status: Full.   History of Present Illness:   Samuel CASSARINO Sr. is a 53 y.o. male with medical history significant of HTN, T2DM, HLD, CKD stage II, BPH presenting with burning chest discomfort.   Patient reports that he was in his usual state of health until last Saturday.  He ordered ox tails for dinner and after eating 1 he began feeling unwell.  States that the texture and taste of the ox tail was different than what he was expecting.  He ultimately went to sleep and the next morning after eating breakfast he began having a burning chest discomfort that persisted throughout the day.  Sunday night, he began noticing hiccups that were fairly frequent.  The following day on Monday, he went to see his primary care doctor and was prescribed a medication (unclear which medication) and he took that for a couple of days without any alleviation of his burning discomfort.  He again saw his primary care doctor on Wednesday and was prescribed ibuprofen which also did not help with his burning discomfort.  On Friday, he was referred to GI by his primary care doctor and was scheduled to be seen this following week for potential endoscopy on Tuesday.  He was advised by GI to come into the ED for further evaluation if  his burning discomfort did not improve.  Given lack of improvement, he presented today for further evaluation.  He does endorse nausea but has not experienced any vomiting. He also endorses mild generalized stomach pain. He denies any fevers, vomiting, palpitations, shortness of breath, diarrhea, constipation, urinary changes.   He does endorse decreased oral intake over the past week due to his symptoms.  Has not been eating or drinking much during this time.   ED course: Vital signs stable.  Initially notable for tachycardia but this has since resolved.  CBC with elevated hemoglobin at 18.9, likely hemoconcentration.  CMP with glucose 146, creatinine 1.17 (around his baseline).  Lipase normal.  BNP mildly elevated at 157.  Troponin 73 > 81 > 98.  EKG with sinus tachycardia, no obvious acute ischemic changes noted.  ED provider consulted cardiology, who recommended observation stay and will see tomorrow morning.  Discussed with cardiology who believe that this is likely more so due to a GI etiology.  Patient given Maalox, IV Pepcid , Toradol , lidocaine  mouth solution, Zofran , IV fluid bolus by ED provider.  [facility] hospitalist asked to evaluate patient for admission   Hospital Course by Problem:  Esophagitis Tiny hiatal hernia Patient presenting with 1 week history of burning chest discomfort with associated nausea and mild generalized abdominal pain.  Lipase normal.  CT imaging negative for aortic aneurysm or dissection.  CT imaging does reveal esophagitis and a tiny hiatal hernia, which are likely the cause of patient's  burning chest discomfort.  His pain has resolved with IV Pepcid , Maalox, and lidocaine  mouthwash.  - GI consulted-status post EGD: esophagitis with bleeding was found in the distal esophagus: Prescribed Carafate  with improvement of symptoms - Diet advanced and tolerated, patient insistent on going home   Elevated troponin. - Cardiology consulted, appreciate assistance -  echocardiogram:severe LVH (most prominent in the septum) with  SAM; LVOT gradient possibly as high as 7.5 m/s with valsalva; findings c/w  HOCM; suggest cardiac MRI to further assess.  -- Suggestive of dehydration so we will do IV fluids after speaking with cardiology-heart rate improved, patient knows since he is going home to continue to hydrate orally   Hypertension Blood pressures ranging in the 110s-130s/80s-90s.  Patient is on diltiazem  120 mg daily, spironolactone  25 mg daily, valsartan  320 mg daily at home.  - Spironolactone  held until patient fully hydrated   Type 2 diabetes with hyperglycemia -HgbA1c: 6.7 - Resume home meds   CKD stage II Baseline creatinine around 1.2.  On admission, creatinine 1.17. - Chronic and stable   Hyperlipidemia - Resume home Lipitor   BPH - Resume home Flomax    3mm R solid pulmonary nodule -noted on CTA imaging, no routine follow up imaging recommended      Medical Consultants:  GI Cardiology    Discharge Exam:   Vitals:   12/07/23 1000 12/07/23 1531  BP:  113/78  Pulse:  (!) 106  Resp: 18 17  Temp:  97.7 F (36.5 C)  SpO2:  99%   Vitals:   12/07/23 0842 12/07/23 0900 12/07/23 1000 12/07/23 1531  BP: 108/64   113/78  Pulse: (!) 116   (!) 106  Resp:  17 18 17   Temp: 98.2 F (36.8 C)   97.7 F (36.5 C)  TempSrc:    Oral  SpO2: 98%   99%  Weight:      Height:        General exam: Appears calm and comfortable.   The results of significant diagnostics from this hospitalization (including imaging, microbiology, ancillary and laboratory) are listed below for reference.     Procedures and Diagnostic Studies:   ECHOCARDIOGRAM COMPLETE Result Date: 12/06/2023    ECHOCARDIOGRAM REPORT   Patient Name:   Samuel FOUNTAIN Sr. Date of Exam: 12/06/2023 Medical Rec #:  [id number]           Height:       72.0 in Accession #:    [id number]          Weight:       211.0 lb Date of Birth:  October 10, 1970            BSA:          2.180 m  Patient Age:    52 years            BP:           119/74 mmHg Patient Gender: M                   HR:           116 bpm. Exam Location:  Inpatient Procedure: 2D Echo, Cardiac Doppler, Color Doppler and Intracardiac            Opacification Agent (Both Spectral and Color Flow Doppler were            utilized during procedure). Indications:    R07.9* Chest pain, unspecified  History:  Patient has prior history of Echocardiogram examinations, most                 recent 05/16/2020. Abnormal ECG, Signs/Symptoms:Murmur; Risk                 Factors:Hypertension, Dyslipidemia and Diabetes. Chordal SAM.                 Severe LVH.  Sonographer:    Raynelle Callow RDCS Referring Phys: [id number] Margo Shells [facility]  Sonographer Comments: Technically difficult study due to poor echo windows. IMPRESSIONS  1. Technically difficult; severe LVH (most prominent in the septum) with SAM; LVOT gradient possibly as high as 7.5 m/s with valsalva; findings c/w HOCM; suggest cardiac MRI to further assess.  2. Left ventricular ejection fraction, by estimation, is >75%. The left ventricle has hyperdynamic function. The left ventricle has no regional wall motion abnormalities. There is severe concentric left ventricular hypertrophy. Indeterminate diastolic filling due to E-A fusion.  3. Right ventricular systolic function is normal. The right ventricular size is normal.  4. The mitral valve is grossly normal. Mild mitral valve regurgitation. No evidence of mitral stenosis.  5. The aortic valve was not well visualized. Aortic valve regurgitation is not visualized. No aortic stenosis is present. Conclusion(s)/Recommendation(s): Findings consistent with hypertrophic obstructive cardiomyopathy. FINDINGS  Left Ventricle: Left ventricular ejection fraction, by estimation, is >75%. The left ventricle has hyperdynamic function. The left ventricle has no regional wall motion abnormalities. Definity  contrast agent was given IV to delineate the left  ventricular endocardial borders. The left ventricular internal cavity size was small. There is severe concentric left ventricular hypertrophy. Indeterminate diastolic filling due to E-A fusion. Right Ventricle: The right ventricular size is normal. Right ventricular systolic function is normal. Left Atrium: Left atrial size was normal in size. Right Atrium: Right atrial size was normal in size. Pericardium: There is no evidence of pericardial effusion. Mitral Valve: The mitral valve is grossly normal. Mild mitral valve regurgitation. No evidence of mitral valve stenosis. Tricuspid Valve: The tricuspid valve is normal in structure. Tricuspid valve regurgitation is not demonstrated. No evidence of tricuspid stenosis. Aortic Valve: The aortic valve was not well visualized. Aortic valve regurgitation is not visualized. No aortic stenosis is present. Pulmonic Valve: The pulmonic valve was not well visualized. Pulmonic valve regurgitation is not visualized. No evidence of pulmonic stenosis. Aorta: The aortic root and ascending aorta are structurally normal, with no evidence of dilitation. IAS/Shunts: The interatrial septum was not well visualized. Additional Comments: Technically difficult; severe LVH (most prominent in the septum) with SAM; LVOT gradient possibly as high as 7.5 m/s with valsalva; findings c/w HOCM; suggest cardiac MRI to further assess.  LEFT VENTRICLE PLAX 2D LVIDd:         3.65 cm     Diastology LVIDs:         2.60 cm     LV e' medial:    6.64 cm/s LV PW:         1.60 cm     LV E/e' medial:  16.6 LV IVS:        1.50 cm     LV e' lateral:   2.07 cm/s LVOT diam:     2.10 cm     LV E/e' lateral: 53.1 LV SV:         56 LV SV Index:   26 LVOT Area:     3.46 cm  LV Volumes (MOD) LV vol d, MOD A2C: 37.6 ml  LV vol d, MOD A4C: 39.3 ml LV vol s, MOD A2C: 5.2 ml LV vol s, MOD A4C: 9.0 ml LV SV MOD A2C:     32.4 ml LV SV MOD A4C:     39.3 ml LV SV MOD BP:      32.0 ml RIGHT VENTRICLE             IVC RV S prime:      14.30 cm/s  IVC diam: 1.00 cm TAPSE (M-mode): 1.6 cm LEFT ATRIUM           Index        RIGHT ATRIUM          Index LA diam:      3.00 cm 1.38 cm/m   RA Area:     9.69 cm LA Vol (A2C): 30.5 ml 13.99 ml/m  RA Volume:   17.70 ml 8.12 ml/m  AORTIC VALVE LVOT Vmax:   747.00 cm/s LVOT Vmean:  98.600 cm/s LVOT VTI:    0.162 m  AORTA Ao Root diam: 3.10 cm Ao Asc diam:  2.90 cm MITRAL VALVE MV Area (PHT): 6.83 cm     SHUNTS MV Decel Time: 111 msec     Systemic VTI:  0.16 m MV E velocity: 110.00 cm/s  Systemic Diam: 2.10 cm Alexandria Angel MD Electronically signed by Alexandria Angel MD Signature Date/Time: 12/06/2023/1:04:26 PM    Final    CT Angio Chest/Abd/Pel for Dissection W and/or W/WO Result Date: 12/05/2023 CLINICAL DATA:  Aortic aneurysm suspected EXAM: CT ANGIOGRAPHY CHEST, ABDOMEN AND PELVIS TECHNIQUE: Non-contrast CT of the chest was initially obtained. Multidetector CT imaging through the chest, abdomen and pelvis was performed using the standard protocol during bolus administration of intravenous contrast. Multiplanar reconstructed images and MIPs were obtained and reviewed to evaluate the vascular anatomy. RADIATION DOSE REDUCTION: This exam was performed according to the departmental dose-optimization program which includes automated exposure control, adjustment of the mA and/or kV according to patient size and/or use of iterative reconstruction technique. CONTRAST:  OMNIPAQUE  IOHEXOL  350 MG/ML SOLN COMPARISON:  August 24, 2020., July 19, 2023 FINDINGS: CTA CHEST FINDINGS Cardiovascular: Preferential opacification of the thoracic aorta. No evidence of thoracic aortic aneurysm, intramural hematoma or dissection. Limited evaluation of the aortic root secondary to motion. Normal heart size. Trace pericardial fluid. Mediastinum/Nodes: Visualized thyroid is unremarkable. No axillary mediastinal adenopathy. No radiopaque foreign body visualized within the sternum. Punctate calcification along the  course of the esophagus, unchanged and likely a calcified hilar lymph node. Lungs/Pleura: No pleural effusion or pneumothorax. Focal area of emphysematous versus posttraumatic/post inflammatory cystic changes in the LEFT lower lobe. Scattered calcified pulmonary nodules. 3 mm RIGHT middle lobe pulmonary nodule (series 6, image 91). Musculoskeletal: No chest wall abnormality. No acute or significant osseous findings. Review of the MIP images confirms the above findings. CTA ABDOMEN AND PELVIS FINDINGS VASCULAR Aorta: Normal caliber aorta without aneurysm, dissection, vasculitis or significant stenosis. Celiac: Patent. There is mild narrowing of the origin with poststenotic dilation which could reflect underlying median arcuate ligament syndrome in the appropriate clinical setting. SMA: Patent without evidence of aneurysm, dissection, vasculitis or significant stenosis. Renals: Both renal arteries are patent without evidence of aneurysm, dissection, vasculitis, fibromuscular dysplasia or significant stenosis. IMA: Patent without evidence of aneurysm, dissection, vasculitis or significant stenosis. Inflow: Patent without evidence of aneurysm, dissection, vasculitis or significant stenosis. Veins: No obvious venous abnormality within the limitations of this arterial phase study. Review of the MIP images confirms the  above findings. NON-VASCULAR Hepatobiliary: Focal fatty deposition along the falciform ligament. Gallbladder is unremarkable. Pancreas: Unremarkable. No pancreatic ductal dilatation or surrounding inflammatory changes. Spleen: Normal in size without focal abnormality. Adrenals/Urinary Tract: Similar fullness of the adrenal glands. Kidneys enhance symmetrically. No hydronephrosis. No obstructing nephrolithiasis. Bladder is decompressed and unremarkable. Stomach/Bowel: No evidence of bowel obstruction. Appendix is normal. Mild circumferential wall prominence of the distal esophagus with a favored tiny hiatal  hernia. Stomach is decompressed. Lymphatic: No suspicious lymphadenopathy. Reproductive: Prostate is unremarkable. Other: No free air or free fluid. Musculoskeletal: No acute or significant osseous findings. Review of the MIP images confirms the above findings. IMPRESSION: 1. No evidence of aortic aneurysm, intramural hematoma or dissection. 2. No radiopaque foreign body visualized within the mediastinum. 3. There is mild circumferential wall prominence of the distal esophagus with a favored tiny hiatal hernia. This could reflect esophagitis. 4. Right solid pulmonary nodule measuring 3 mm. Per Fleischner Society Guidelines, no routine follow-up imaging is recommended. These guidelines do not apply to immunocompromised patients and patients with cancer. Follow up in patients with significant comorbidities as clinically warranted. For lung cancer screening, adhere to Lung-RADS guidelines. Reference: Radiology. 2017; 284(1):228-43. Electronically Signed   By: Clancy Crimes M.D.   On: 12/05/2023 13:51   DG Chest Port 1 View Result Date: 12/05/2023 CLINICAL DATA:  Shortness of breath.  Hypertension. EXAM: PORTABLE CHEST 1 VIEW COMPARISON:  None Available. FINDINGS: The heart size and mediastinal contours are within normal limits. Both lungs are clear. The visualized skeletal structures are unremarkable. IMPRESSION: No active disease. Electronically Signed   By: Marlyce Sine M.D.   On: 12/05/2023 10:31     Labs:   Basic Metabolic Panel: Recent Labs  Lab 12/05/23 1011 12/06/23 0439 12/07/23 0519  NA 135 135 134*  K 3.6 3.8 3.9  CL 103 104 105  CO2 24 22 22   GLUCOSE 146* 89 106*  BUN 26* 26* 22*  CREATININE 1.17 1.26* 1.19  CALCIUM  9.0 8.4* 8.4*   GFR Estimated Creatinine Clearance: 87.1 mL/min (by C-G formula based on SCr of 1.19 mg/dL). Liver Function Tests: Recent Labs  Lab 12/05/23 1011  AST 19  ALT 24  ALKPHOS 66  BILITOT 1.5*  PROT 6.6  ALBUMIN  3.6   Recent Labs  Lab  12/05/23 1011  LIPASE 41   No results for input(s): "AMMONIA" in the last 168 hours. Coagulation profile No results for input(s): "INR", "PROTIME" in the last 168 hours.  CBC: Recent Labs  Lab 12/05/23 1011 12/06/23 0439 12/07/23 0519  WBC 9.8 8.8 7.5  NEUTROABS 5.6  --   --   HGB 18.9* 17.4* 16.8  HCT 54.9* 50.5 49.5  MCV 85.0 86.2 87.1  PLT 233 215 197   Cardiac Enzymes: No results for input(s): "CKTOTAL", "CKMB", "CKMBINDEX", "TROPONINI" in the last 168 hours. BNP: Invalid input(s): "POCBNP" CBG: Recent Labs  Lab 12/06/23 1445 12/06/23 1711 12/06/23 2115 12/07/23 0732 12/07/23 1128  GLUCAP 74 90 95 97 132*   D-Dimer No results for input(s): "DDIMER" in the last 72 hours. Hgb A1c Recent Labs    12/05/23 1811  HGBA1C 6.7*   Lipid Profile No results for input(s): "CHOL", "HDL", "LDLCALC", "TRIG", "CHOLHDL", "LDLDIRECT" in the last 72 hours. Thyroid function studies No results for input(s): "TSH", "T4TOTAL", "T3FREE", "THYROIDAB" in the last 72 hours.  Invalid input(s): "FREET3" Anemia work up No results for input(s): "VITAMINB12", "FOLATE", "FERRITIN", "TIBC", "IRON", "RETICCTPCT" in the last 72 hours. Microbiology No results found for  this or any previous visit (from the past 240 hours).   Discharge Instructions:   Discharge Instructions     Diet Carb Modified   Complete by: As directed    Discharge instructions   Complete by: As directed    Protonix  40 mg PO BID for 3 months and then every day. Carafate  QID for 4 weeks. F/U with Dr. Honey Lusty in 4-6 weeks.   Increase activity slowly   Complete by: As directed       Allergies as of 12/07/2023       Reactions   Metformin And Related    Gastric upset        Medication List     PAUSE taking these medications    spironolactone  25 MG tablet Wait to take this until your doctor or other care provider tells you to start again. Commonly known as: ALDACTONE  Take 1 tablet (25 mg total) by  mouth daily. Schedule an appointment for further refills, second attempt       STOP taking these medications    valsartan  320 MG tablet Commonly known as: DIOVAN        TAKE these medications    atorvastatin  40 MG tablet Commonly known as: LIPITOR Take 1 tablet (40 mg total) by mouth daily.   diltiazem  120 MG 24 hr capsule Commonly known as: CARDIZEM  CD Take 1 capsule (120 mg total) by mouth daily.   ibuprofen 800 MG tablet Commonly known as: ADVIL Take 800 mg by mouth 3 (three) times daily.   Jardiance  25 MG Tabs tablet Generic drug: empagliflozin  Take 25 mg by mouth daily.   Lantus  SoloStar 100 UNIT/ML Solostar Pen Generic drug: insulin  glargine Inject 10 Units into the skin at bedtime.   multivitamin Tabs tablet Take 1 tablet by mouth daily.   ondansetron  8 MG tablet Commonly known as: ZOFRAN  Take 8 mg by mouth every 8 (eight) hours as needed.   pantoprazole  40 MG tablet Commonly known as: Protonix  Take 1 tablet (40 mg total) by mouth 2 (two) times daily.   Rybelsus  14 MG Tabs Generic drug: Semaglutide  Take 1 tablet by mouth daily.   sucralfate  1 GM/10ML suspension Commonly known as: CARAFATE  Take 10 mLs (1 g total) by mouth 4 (four) times daily -  with meals and at bedtime.   tadalafil 5 MG tablet Commonly known as: CIALIS Take 5 mg by mouth daily.   tamsulosin  0.4 MG Caps capsule Commonly known as: FLOMAX  Take 0.4 mg by mouth at bedtime.   Vitamin D (Ergocalciferol) 1.25 MG (50000 UNIT) Caps capsule Commonly known as: DRISDOL Take 50,000 Units by mouth once a week. On Sundays        Follow-up Information     Trellis Fries, MD Follow up in 1 week(s).   Specialty: Family Medicine Contact information: [address] [place] [address] [phone]                  Time coordinating discharge: 45 minutes  Signed:  Enrigue Harvard DO  [facility] 12/07/2023, 5:03 PM

## 2023-12-08 ENCOUNTER — Encounter (HOSPITAL_COMMUNITY): Payer: Self-pay | Admitting: Gastroenterology

## 2023-12-30 ENCOUNTER — Ambulatory Visit: Attending: Physician Assistant | Admitting: Physician Assistant

## 2023-12-30 NOTE — Progress Notes (Signed)
 This encounter was created in error - please disregard.

## 2024-03-06 ENCOUNTER — Other Ambulatory Visit (HOSPITAL_COMMUNITY): Payer: Self-pay

## 2024-03-15 ENCOUNTER — Other Ambulatory Visit (HOSPITAL_COMMUNITY): Payer: Self-pay

## 2024-03-24 ENCOUNTER — Other Ambulatory Visit (HOSPITAL_COMMUNITY): Payer: Self-pay

## 2024-03-28 ENCOUNTER — Other Ambulatory Visit (HOSPITAL_COMMUNITY): Payer: Self-pay

## 2024-03-30 ENCOUNTER — Other Ambulatory Visit (HOSPITAL_COMMUNITY): Payer: Self-pay

## 2024-03-31 ENCOUNTER — Other Ambulatory Visit (HOSPITAL_COMMUNITY): Payer: Self-pay

## 2024-04-06 ENCOUNTER — Encounter (HOSPITAL_COMMUNITY): Payer: Self-pay

## 2024-04-06 ENCOUNTER — Other Ambulatory Visit (HOSPITAL_COMMUNITY): Payer: Self-pay

## 2024-04-17 ENCOUNTER — Other Ambulatory Visit: Payer: Self-pay | Admitting: Student

## 2024-04-18 ENCOUNTER — Encounter: Payer: Self-pay | Admitting: Cardiology

## 2024-04-18 ENCOUNTER — Other Ambulatory Visit: Payer: Self-pay | Admitting: Student

## 2024-04-18 MED ORDER — DILTIAZEM HCL ER COATED BEADS 120 MG PO CP24: 120.0000 mg | ORAL_CAPSULE | Freq: Every day | ORAL | 0 refills | Status: DC

## 2024-04-18 MED ORDER — VALSARTAN 320 MG PO TABS: 320.0000 mg | ORAL_TABLET | Freq: Every day | ORAL | 0 refills | Status: DC

## 2024-04-18 NOTE — Telephone Encounter (Signed)
 Spoke to patient. Patient is aware he needs an appointment last office visit  04/02/23. Missed appt in 12/2023.      Appointment schedule for 06/27/24 at 9 am.   Valsartan  320 mg  reordered   Patient wanted to no what Tiadyt ER 120 mg that was ordered.   RN informed patient the medication is the brand name to Diltiazem  120 mg.  Patient voiced understanding.

## 2024-04-19 ENCOUNTER — Other Ambulatory Visit: Payer: Self-pay | Admitting: Student

## 2024-05-14 ENCOUNTER — Other Ambulatory Visit: Payer: Self-pay | Admitting: Student

## 2024-05-18 ENCOUNTER — Other Ambulatory Visit: Payer: Self-pay | Admitting: Student

## 2024-05-18 MED ORDER — DILTIAZEM HCL ER BEADS 120 MG PO CP24: 120.0000 mg | ORAL_CAPSULE | Freq: Every day | ORAL | 0 refills | Status: DC

## 2024-05-18 NOTE — Addendum Note (Signed)
 Addended by: CHAUVIGNE, Louise Victory on: 05/18/2024 05:23 PM   Modules accepted: Orders

## 2024-06-27 ENCOUNTER — Encounter: Payer: Self-pay | Admitting: Cardiology

## 2024-06-27 ENCOUNTER — Telehealth: Payer: Self-pay | Admitting: *Deleted

## 2024-06-27 ENCOUNTER — Ambulatory Visit: Attending: Cardiology | Admitting: Cardiology

## 2024-06-27 VITALS — BP 110/72 | HR 111 | Resp 16 | Ht 72.0 in | Wt 216.7 lb

## 2024-06-27 DIAGNOSIS — E1169 Type 2 diabetes mellitus with other specified complication: Secondary | ICD-10-CM

## 2024-06-27 DIAGNOSIS — I421 Obstructive hypertrophic cardiomyopathy: Secondary | ICD-10-CM | POA: Insufficient documentation

## 2024-06-27 DIAGNOSIS — I10 Essential (primary) hypertension: Secondary | ICD-10-CM

## 2024-06-27 MED ORDER — VALSARTAN 160 MG PO TABS: 160.0000 mg | ORAL_TABLET | Freq: Every day | ORAL | 3 refills | Status: DC

## 2024-06-27 MED ORDER — DILTIAZEM HCL ER COATED BEADS 240 MG PO CP24: 240.0000 mg | ORAL_CAPSULE | Freq: Every day | ORAL | 3 refills | Status: AC

## 2024-06-27 NOTE — Telephone Encounter (Signed)
 Called patient to give him  radiology scheduling number  for him to call to schedule upcoming  Cardiac MRI

## 2024-06-27 NOTE — Patient Instructions (Addendum)
 Medication Instructions:   Changed Diltiazem  to 240 mg  daily   Changed to Valsartan  to 160 mg daily   *If you need a refill on your cardiac medications before your next appointment, please call your pharmacy*   Lab Work: Not needed If you have labs (blood work) drawn today and your tests are completely normal, you will receive your results only by: MyChart Message (if you have MyChart) OR A paper copy in the mail If you have any lab test that is abnormal or we need to change your treatment, we will call you to review the results.   Testing/Procedures:  1) Your physician has requested that you have a cardiac MRI. Cardiac MRI uses a computer to create images of your heart as its beating, producing both still and moving pictures of your heart and major blood vessels. For further information please visit . Please follow the instruction sheet given to you today for more information.   Follow-Up: At [facility], you and your health needs are our priority.  As part of our continuing mission to provide you with exceptional heart care, we have created designated Provider Care Teams.  These Care Teams include your primary Cardiologist (physician) and Advanced Practice Providers (APPs -  Physician Assistants and Nurse Practitioners) who all work together to provide you with the care you need, when you need it.     Your next appointment:   6 month(s)  The format for your next appointment:   In Person  Provider:   Alm Clay, MD  You have been referred to Dr Santo for  HOCM after Cardiac MRI is completed

## 2024-06-27 NOTE — Progress Notes (Unsigned)
 Cardiology Office Note:  .   Date:  06/27/2024  ID:  Samuel LITTIE Lobstein Sr., DOB 08/29/1970, MRN [id number] PCP: Joshua Francisco, MD  [facility] Providers Cardiologist:  Alm Clay, MD { Click to update primary MD,subspecialty MD or APP then REFRESH:1}    Chief Complaint  Patient presents with   Essential hypertension   Follow-up    3 months    Patient Profile: .     Samuel LITTIE Lobstein Sr. is a *** 53 y.o. male *** with a PMH notable for *** who presents here for *** at the request of Joshua Francisco, MD.  {There is no content from the last Narrative History section.}      Samuel GORUM Sr. was last seen on ***  Subjective  Discussed the use of AI scribe software for clinical note transcription with the patient, who gave verbal consent to proceed.  History of Present Illness      Cardiovascular ROS: {roscv:310661}  ROS:  Review of Systems - {ros master:310782}    Objective    Studies Reviewed: SABRA   EKG Interpretation Date/Time:  Tuesday June 27 2024 09:05:19 EST Ventricular Rate:  111 PR Interval:  158 QRS Duration:  96 QT Interval:  354 QTC Calculation: 481 R Axis:   26  Text Interpretation: Sinus tachycardia Minimal voltage criteria for LVH, may be normal variant ( Cornell product ) with repolarization abnormality When compared with ECG of 05-Dec-2023 10:04, Rate faster Confirmed by Clay Alm ([id number]) on 06/27/2024 9:34:53 AM    Lab Results  Component Value Date   HGBA1C 6.7 (H) 12/05/2023   No results found for: CHOL, HDL, LDLCALC, LDLDIRECT, TRIG, CHOLHDL  Results   MONITOR: 14-day ZIO showed occasional PVCs with couplets and triplets (1.3%), short spells of bigeminy and trigeminy, rare PACs, 7 runs of NSVT, 6 runs of A. tach with symptoms noted during sinus rhythm and PVCs.    Risk Assessment/Calculations:               Physical Exam:   VS:  BP 110/72 (BP Location: Left Arm, Patient Position: Sitting, Cuff Size:  Large)   Pulse (!) 111   Resp 16   Ht 6' (1.829 m)   Wt 216 lb 11.2 oz (98.3 kg)   SpO2 98%   BMI 29.39 kg/m    Wt Readings from Last 3 Encounters:  06/27/24 216 lb 11.2 oz (98.3 kg)  12/05/23 211 lb (95.7 kg)  04/02/23 226 lb (102.5 kg)    GEN: Well nourished, well developed in no acute distress; healthy appearing. NECK: No JVD; No carotid bruits CARDIAC: Normal S1, S2 & S4; tACHYCARDIC; 2/6 sem heard throughout;m , no rubs RESPIRATORY:  Clear to auscultation without rales, wheezing or rhonchi ; nonlabored, good air movement. ABDOMEN: Soft, non-tender, non-distended EXTREMITIES:  No edema; No deformity     ASSESSMENT AND PLAN: .    Problem List Items Addressed This Visit       Cardiology Problems   Essential hypertension - Primary (Chronic)   Relevant Orders   EKG 12-Lead (Completed)   HOCM (hypertrophic obstructive cardiomyopathy) (HCC)   Hyperlipidemia associated with type 2 diabetes mellitus (HCC) (Chronic)    Assessment & Plan           Follow-Up: No follow-ups on file.  I spent *** minutes in the care of Samuel MAJKOWSKI Sr. today including {CHL AMB CAR Time Based Billing Options STW (Optional):[phone]::documenting in the encounter.}      Signed,  Alm MICAEL Clay, MD, MS Alm Clay, M.D., M.S. Interventional Cardiologist  [facility] Pager # [phone]

## 2024-06-28 LAB — HEMOGLOBIN AND HEMATOCRIT, BLOOD
Hematocrit: 53.4 % — ABNORMAL HIGH (ref 37.5–51.0)
Hemoglobin: 17.7 g/dL (ref 13.0–17.7)

## 2024-06-29 ENCOUNTER — Ambulatory Visit: Payer: Self-pay | Admitting: Cardiology

## 2024-06-29 NOTE — Assessment & Plan Note (Deleted)
 Hypertrophic obstructive cardiomyopathy Severe LV hypertrophy with SAM and LVOT gradient. EF >75%. No significant CAD or aortic aneurysm. Cardiac MRI recommended to assess tissue characteristics and differentiate hypertrophy etiology. Discussed potential treatment options, not currently indicated. - Ordered cardiac MRI to assess tissue characteristics. - Referred to Dr. Durell for further evaluation and management. - Adjusted medication: increased diltiazem  to 240 mg and decreased valsartan  to 160 mg. - Stressed importance of holding diuretics

## 2024-06-29 NOTE — Assessment & Plan Note (Signed)
 Heart rate 111 today.  He has had issues with this in the past.  Not favorable given HOCM. Will titrate diltiazem  to 240 mg and reduced at valsartan  to 160. Avoid dehydration.

## 2024-06-29 NOTE — Assessment & Plan Note (Signed)
 Hypertrophic obstructive cardiomyopathy Severe LV hypertrophy with SAM and LVOT gradient. EF >75%. No significant CAD or aortic aneurysm. Cardiac MRI recommended to assess tissue characteristics and differentiate hypertrophy etiology. Discussed potential treatment options, not currently indicated. - Ordered cardiac MRI to assess tissue characteristics. - Referred to Dr. Durell for further evaluation and management. - Adjusted medication: increased diltiazem  to 240 mg and decreased valsartan  to 160 mg. - Stressed importance of holding diuretics

## 2024-06-29 NOTE — Assessment & Plan Note (Signed)
 Seem to much better controlled.  Doing well off beta-blocker.  Continue to monitor.  Try to avoid triggers. -Continue diltiazem , increasing to 240 mg daily

## 2024-06-29 NOTE — Assessment & Plan Note (Signed)
 Pretty stable not require standpoint.  I think his weight loss of notably improved.  He is on less medications.  Plan to reduce ARB and increase diltiazem  for more rate control and vasodilation.SABRA

## 2024-06-29 NOTE — Assessment & Plan Note (Addendum)
 Blood pressure well-controlled on lower doses of medications and he had been before  Current management includes valsartan  360 mg daily and diltiazem  20 mg daily.  Plan to optimize heart rate control to reduce cardiac strain and outflow gradient. - Adjusted medication: increased diltiazem  to 240 mg and decreased valsartan  to 160 mg. Avoid diuretics and avoid dehydration.  Avoid triggers such as caffeine

## 2024-06-29 NOTE — Assessment & Plan Note (Signed)
 Chest comfort episodes were evaluated with Coronary CTA revealing minimal CAD.  Reassuring.  Coronary Calcium  Score only 2.

## 2024-06-29 NOTE — Assessment & Plan Note (Addendum)
 I think labs are being checked by [facility].  He is on Jardiance  and Lantus  as well as Rybelsus .  Stressed importance of staying adequately hydrated with Rybelsus .  Apparently he is not taking statin. Not on lipid medicine but will likely benefit from Rybelsus .

## 2024-06-29 NOTE — Progress Notes (Signed)
 Last read by Ozell LITTIE Lobstein Sr. at 10:32AM on 06/29/2024.

## 2024-06-29 NOTE — Progress Notes (Signed)
 Last read by Ozell LITTIE Lobstein Sr. at 10:42AM on 06/29/2024.

## 2024-07-10 ENCOUNTER — Encounter (HOSPITAL_COMMUNITY): Payer: Self-pay

## 2024-07-11 ENCOUNTER — Other Ambulatory Visit (HOSPITAL_COMMUNITY)

## 2024-07-14 ENCOUNTER — Ambulatory Visit (HOSPITAL_COMMUNITY): Attending: Cardiology

## 2024-07-15 ENCOUNTER — Other Ambulatory Visit: Payer: Self-pay | Admitting: Student

## 2024-07-15 ENCOUNTER — Other Ambulatory Visit: Payer: Self-pay | Admitting: Cardiology

## 2024-07-17 NOTE — Telephone Encounter (Signed)
 Can try metoprolol  succinate 50 mg nightly, instead of the cardizem . Please send 30 days 1 refill. Defer further refills to Dr Anner in future.  Thanks MJP

## 2024-07-19 MED ORDER — METOPROLOL SUCCINATE ER 50 MG PO TB24: 50.0000 mg | ORAL_TABLET | Freq: Every day | ORAL | 1 refills | Status: AC

## 2024-07-19 NOTE — Addendum Note (Signed)
 Addended by: JOSHUA ANDREZ PARAS on: 07/19/2024 08:05 AM   Modules accepted: Orders

## 2024-07-25 ENCOUNTER — Encounter (HOSPITAL_COMMUNITY): Payer: Self-pay | Admitting: Cardiology

## 2024-08-08 ENCOUNTER — Other Ambulatory Visit (HOSPITAL_COMMUNITY): Payer: Self-pay | Admitting: Student

## 2024-08-08 DIAGNOSIS — R6881 Early satiety: Secondary | ICD-10-CM

## 2024-08-15 MED ORDER — VALSARTAN 320 MG PO TABS: 320.0000 mg | ORAL_TABLET | Freq: Every day | ORAL | 1 refills | Status: AC

## 2024-08-15 NOTE — Telephone Encounter (Signed)
 Would go back to full dose of Valsartan  320mg   [facility]

## 2024-08-15 NOTE — Addendum Note (Signed)
 Addended by: LORING ANDRIETTE HERO on: 08/15/2024 04:28 PM   Modules accepted: Orders

## 2024-10-10 ENCOUNTER — Ambulatory Visit: Admitting: Internal Medicine
